# Patient Record
Sex: Female | Born: 1970
Health system: Southern US, Community
[De-identification: ages and names within clinical notes are randomized; demographics above are authoritative.]

## PROBLEM LIST (undated history)

## (undated) DIAGNOSIS — I10 Essential (primary) hypertension: Secondary | ICD-10-CM

## (undated) HISTORY — PX: ABDOMINAL HYSTERECTOMY: SHX81

## (undated) HISTORY — DX: Essential (primary) hypertension: I10

---

## 2006-05-11 ENCOUNTER — Ambulatory Visit: Payer: Self-pay | Admitting: Family Medicine

## 2008-02-08 HISTORY — PX: TUBAL LIGATION: SHX77

## 2008-12-09 ENCOUNTER — Inpatient Hospital Stay: Payer: Self-pay

## 2009-01-08 ENCOUNTER — Encounter: Payer: Self-pay | Admitting: Maternal & Fetal Medicine

## 2009-01-08 ENCOUNTER — Inpatient Hospital Stay: Payer: Self-pay | Admitting: Obstetrics and Gynecology

## 2010-11-18 IMAGING — US US OB US >=[ID] SNGL FETUS
1 series · 17 of 28 positions shown · non-contrast
Comparison: none

REASON FOR EXAM: Placenta Previa 32 weeks EGA
COMMENTS:

[Series 1: us ob us >=(id) sngl fetus · 17 of 33 slices shown]
[im 1/33]
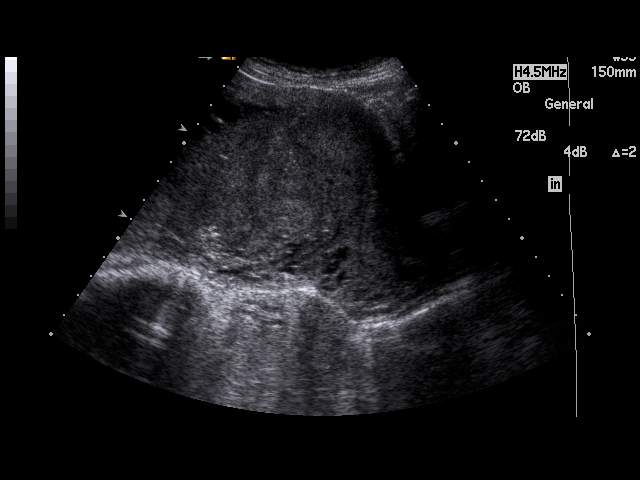
[im 3/33]
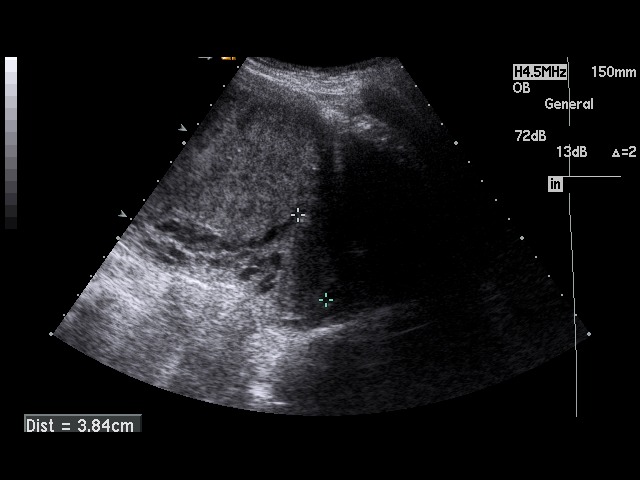
[im 5/33]
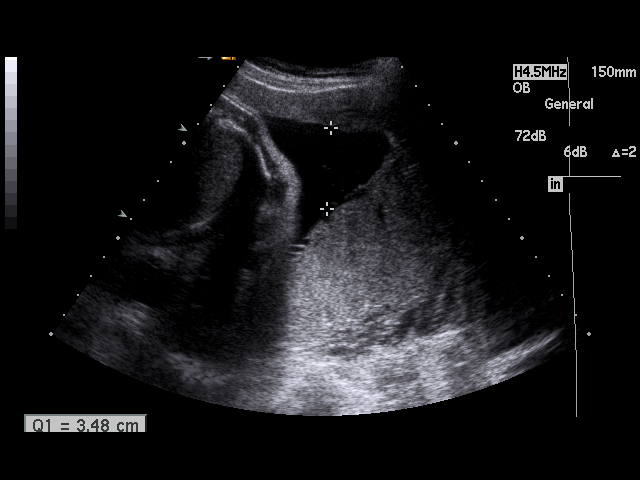
[im 6/33]
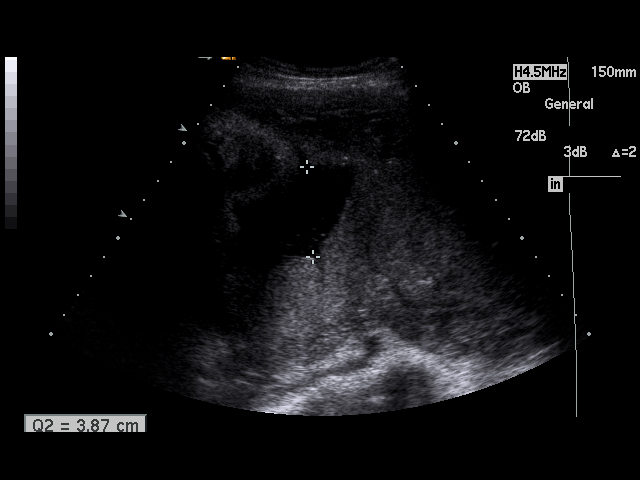
[im 9/33]
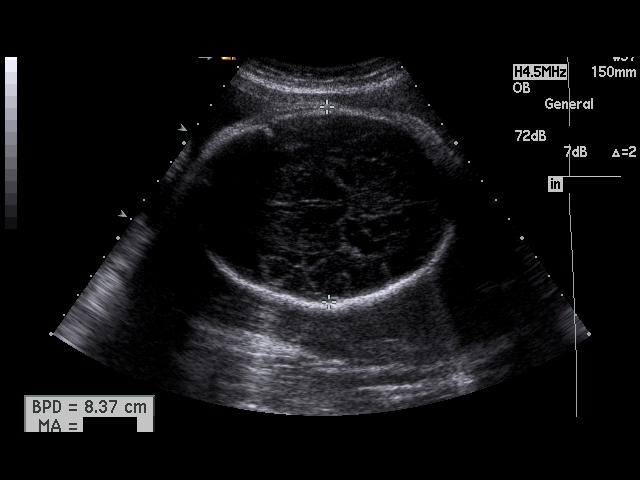
[im 11/33]
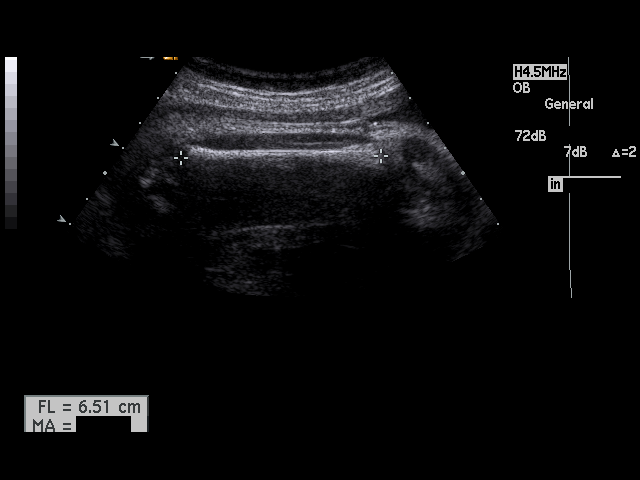
[im 12/33]
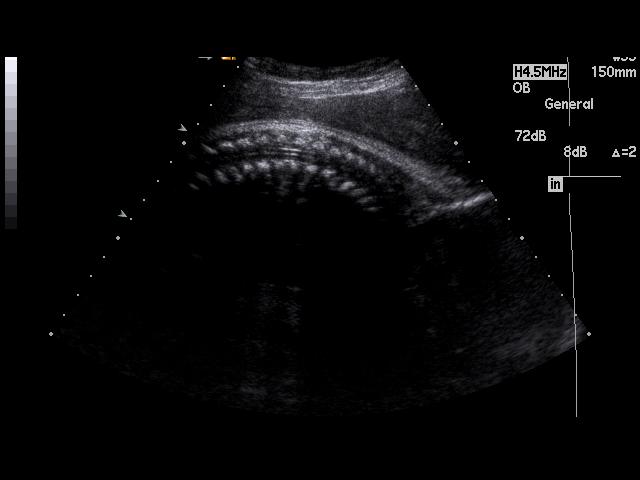
[im 15/33]
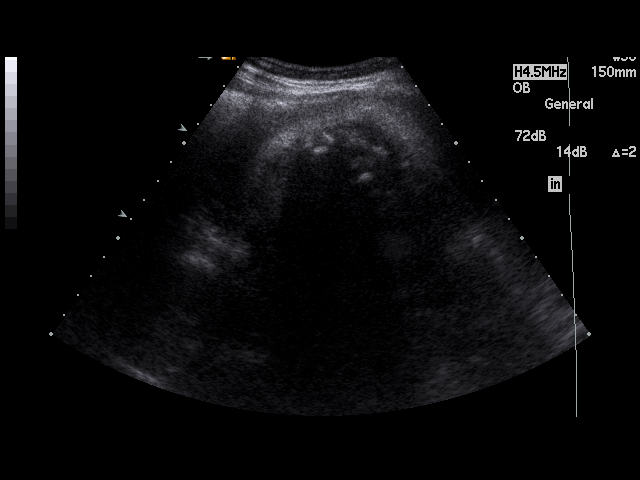
[im 17/33]
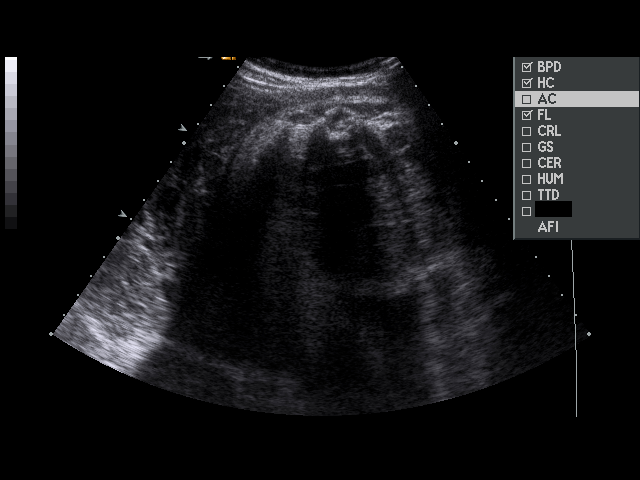
[im 18/33]
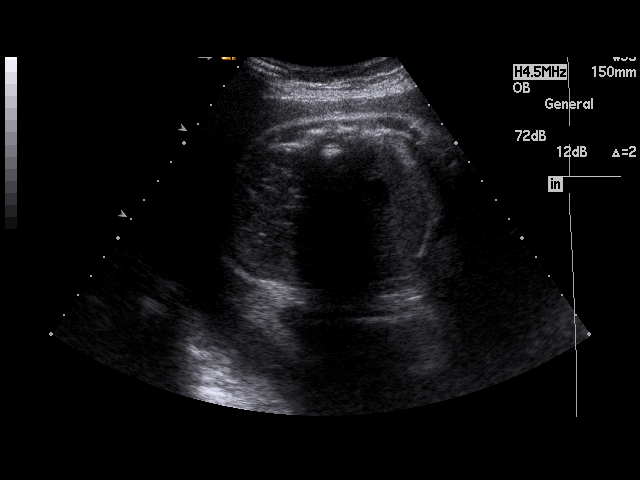
[im 21/33]
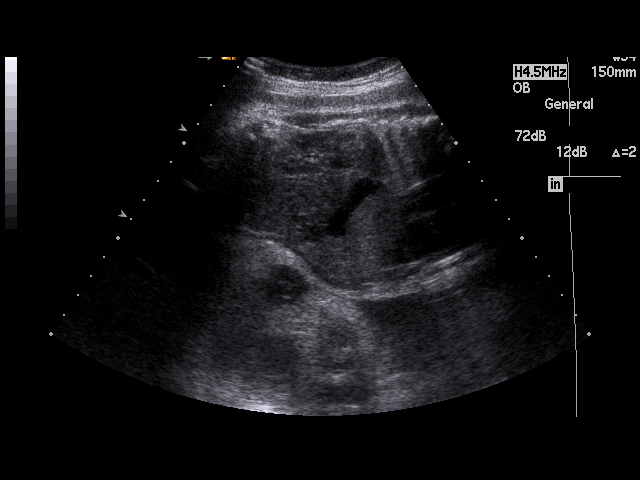
[im 22/33]
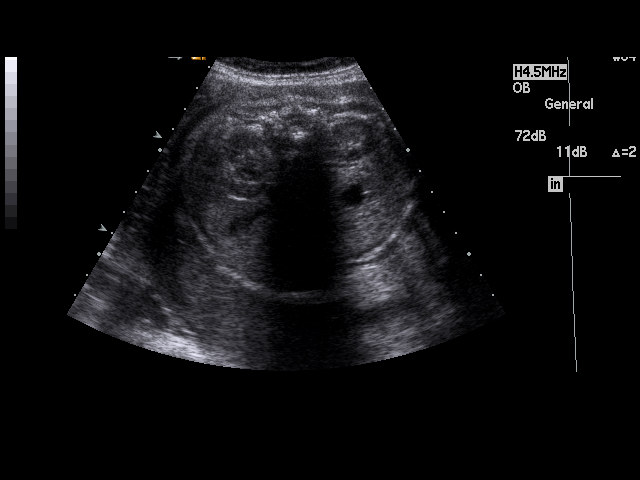
[im 24/33]
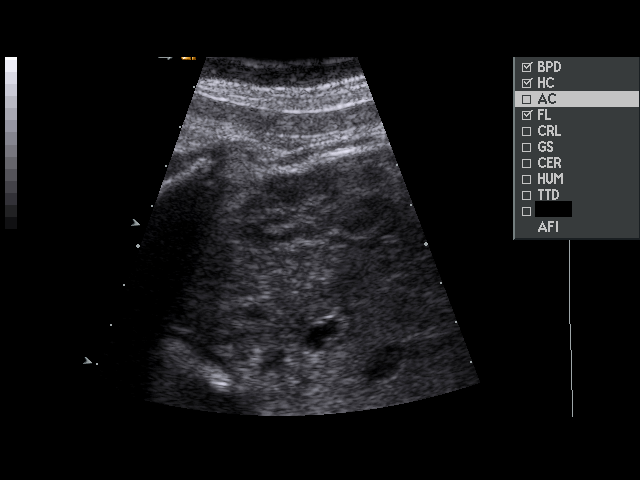
[im 27/33]
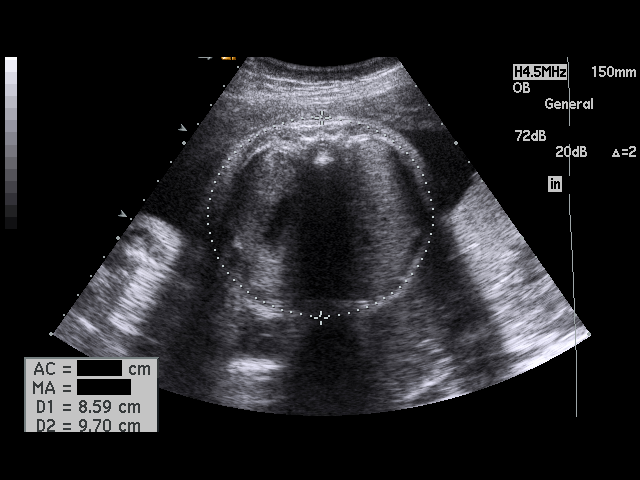
[im 28/33]
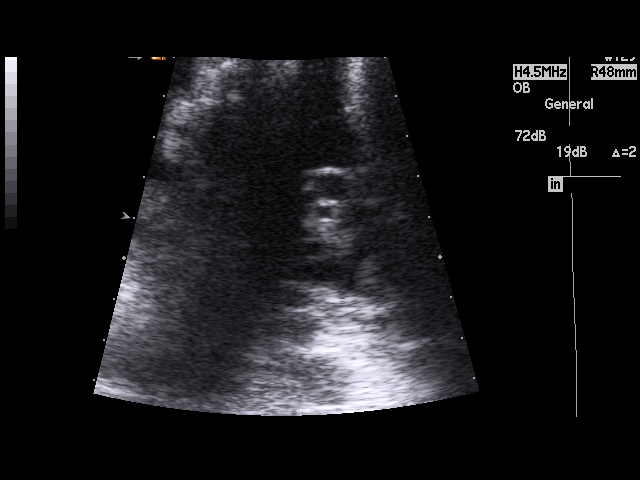
[im 30/33]
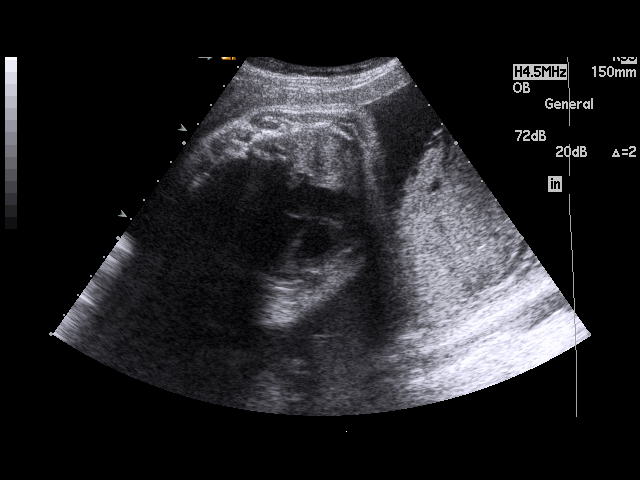
[im 33/33]
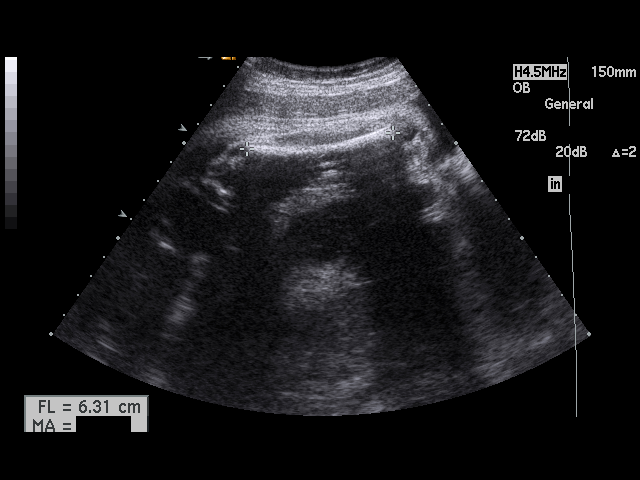

[17 of 28 positions shown; findings below may reference images not displayed]

PROCEDURE:     US  - US OB GREATER/OR EQUAL TO PDBN0  - December 12, 2008  [DATE]

RESULT:     There is observed a single living intrauterine gestation.
Presentation currently is transverse. Fetal heart rate was monitored at 163
beats per minute. Amnionic fluid volume appears normal. The placenta is low
and completely traverses the region of the cervix compatible with complete
placenta previa.

The fetal heart, stomach, and urinary bladder are visualized. No
hydrocephalus or hydronephrosis is seen. No fetal abnormalities are
identified. Fetal measurements are as follows:

BPD     8.37 cm      Corresponding to 33 weeks 5 days.
HC          30.98 cm      Corresponding to 34 weeks 4 days.
AC          28.46 cm      Corresponding to 32 weeks 3 days.
FL           6.41 cm     Corresponding to 33 weeks 1 day

EFW is to 2,083 grams + / - 312 grams. AFI is 14.72 cm. Average ultrasound age
is 33 weeks 3 days. Ultrasound EDD based on today's measurements is [DATE]. Living intrauterine gestation of approximately 33 weeks 3 days
gestational age.
2. Complete placenta previa is observed.
3. Fetal measurements are as noted above.

## 2010-12-15 IMAGING — US US OB DETAIL+14 WK - NRPT [facility]
1 series · 14 of 28 positions shown · non-contrast
Comparison: none

[Series 1: us ob detail+14 wk - nrpt [facility] · 14 of 66 slices shown]
[im 3/66]
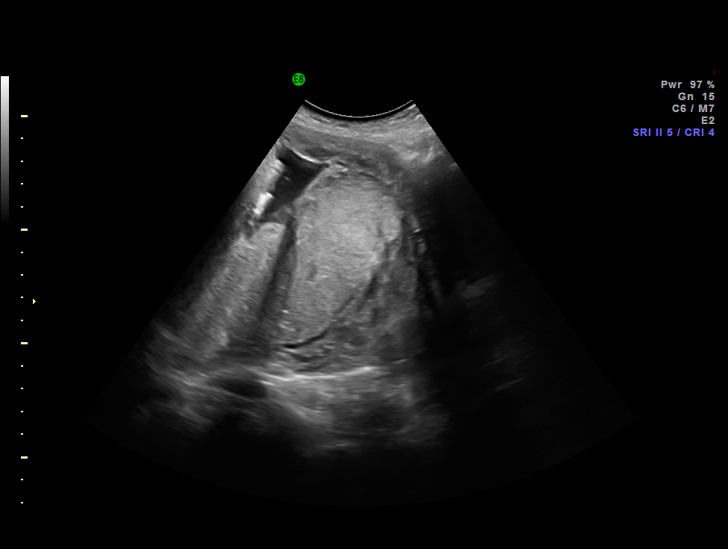
[im 8/66]
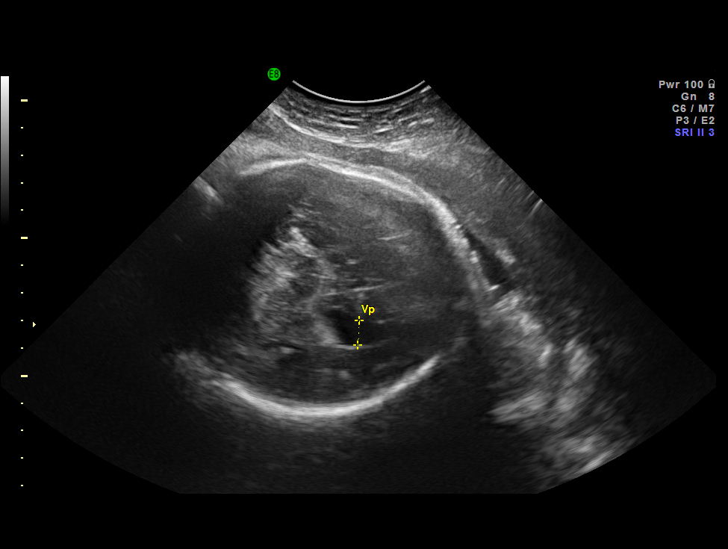
[im 13/66]
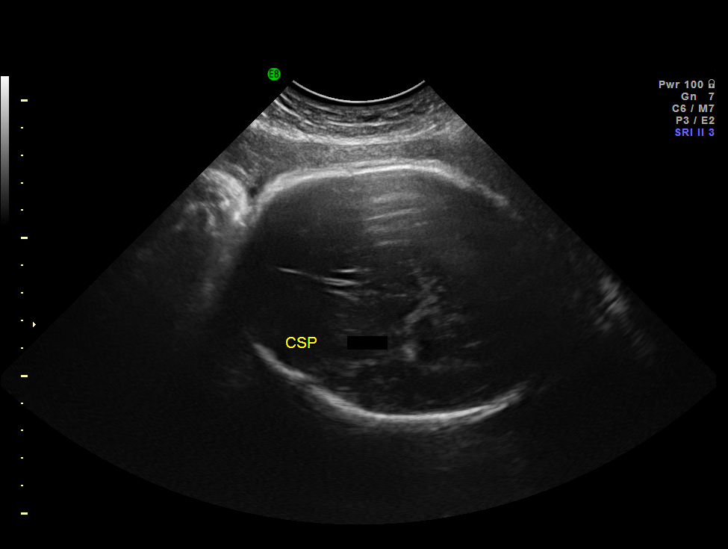
[im 17/66]
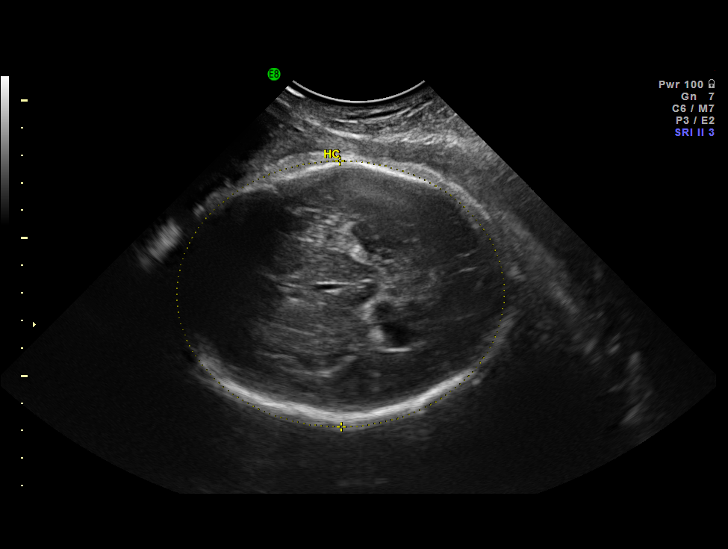
[im 22/66]
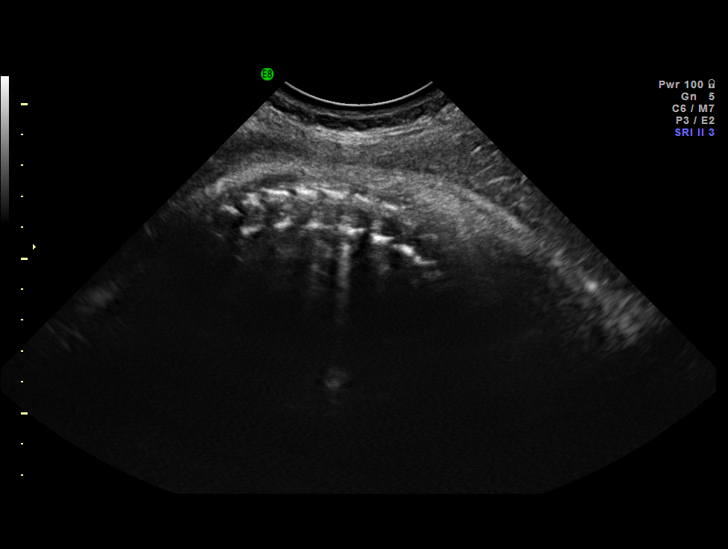
[im 27/66]
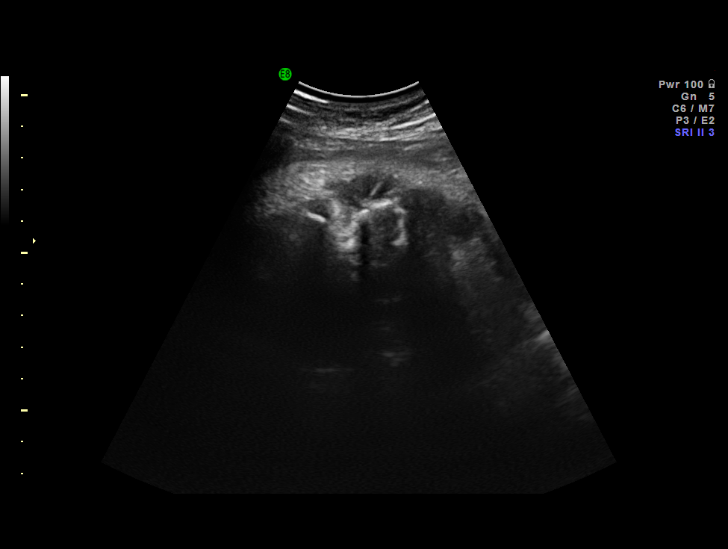
[im 32/66]
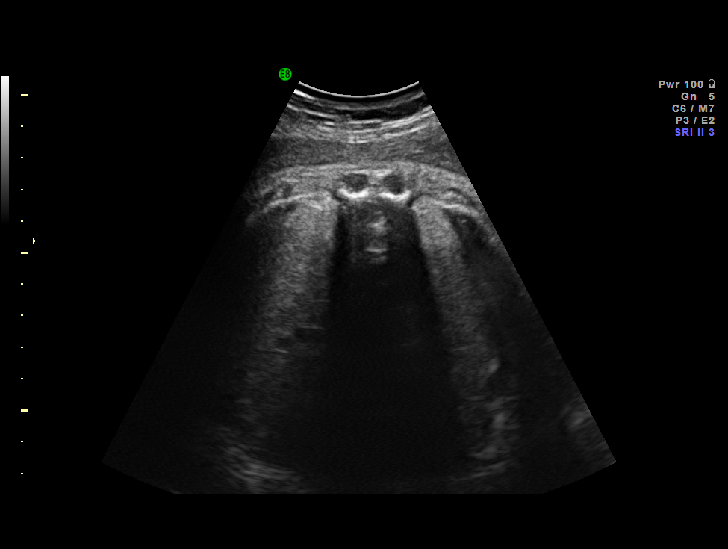
[im 37/66]
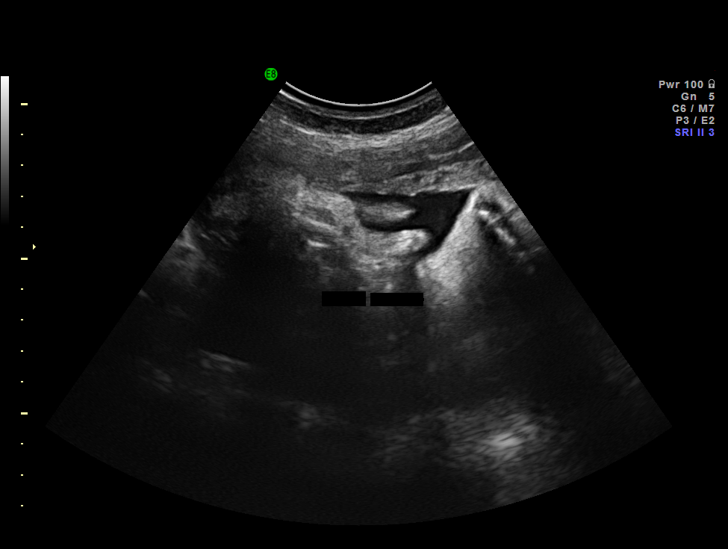
[im 41/66]
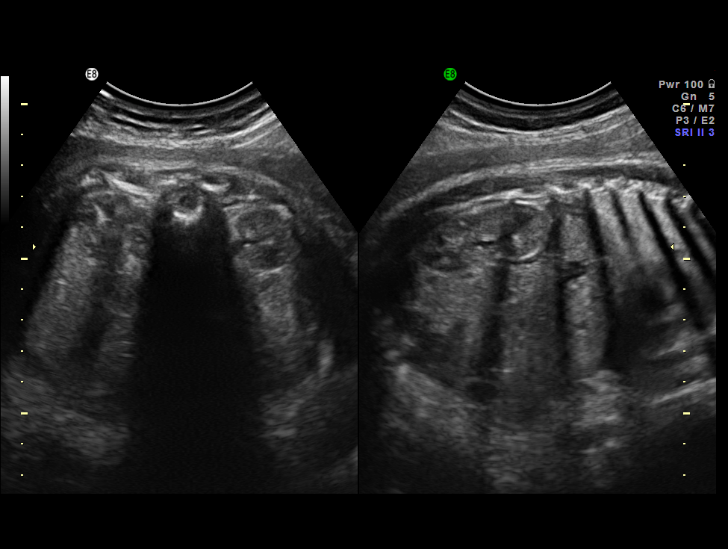
[im 46/66]
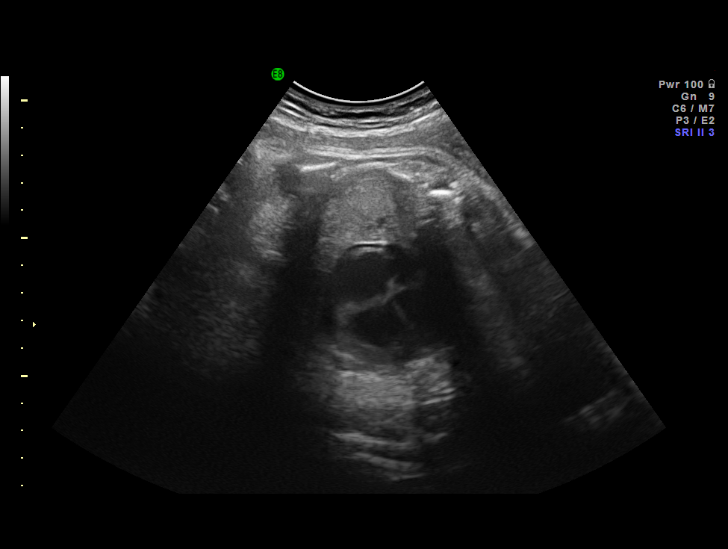
[im 51/66]
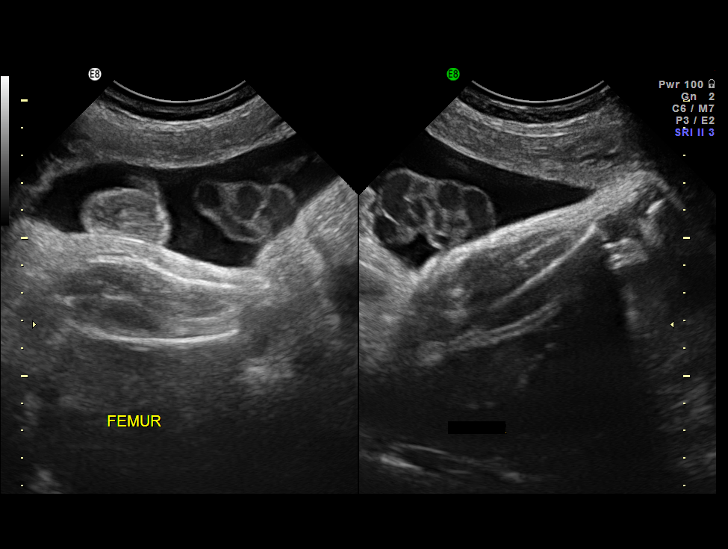
[im 56/66]
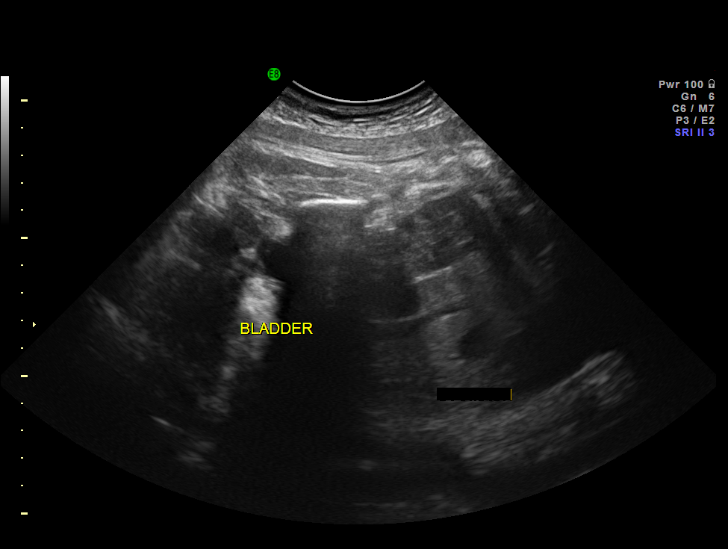
[im 61/66]
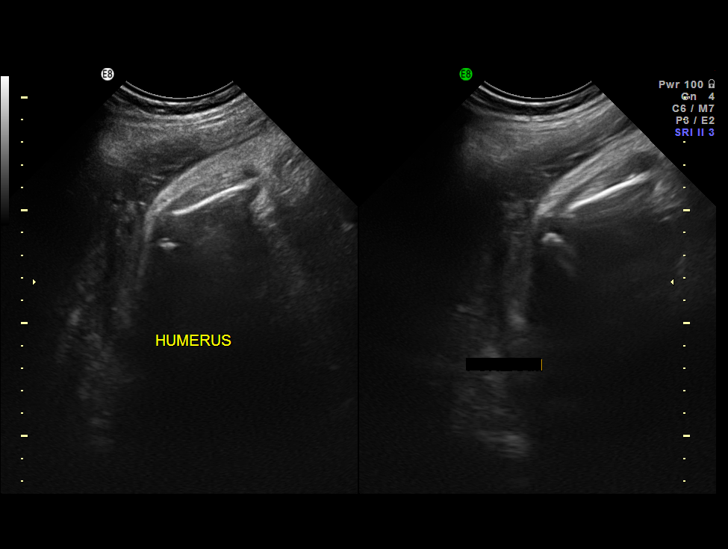
[im 66/66]
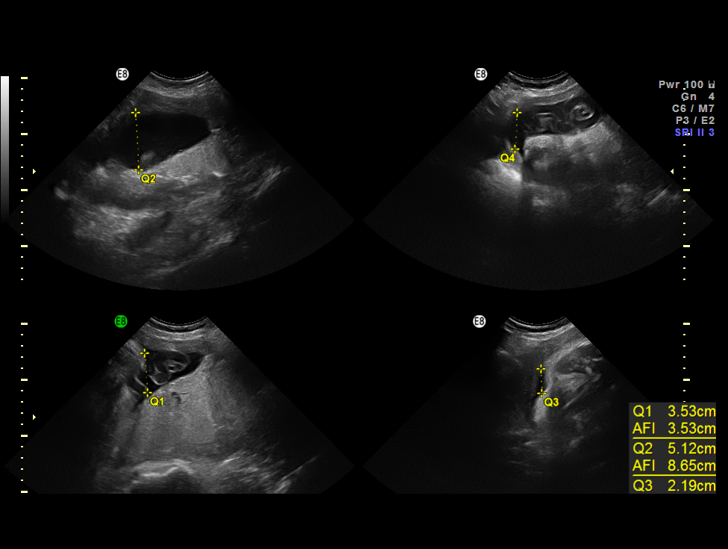

[14 of 28 positions shown; findings below may reference images not displayed]

IMAGES IMPORTED FROM THE [employer] WORKFLOW SYSTEM
NO DICTATION FOR STUDY

## 2013-02-07 HISTORY — PX: BREAST BIOPSY: SHX20

## 2013-05-14 DIAGNOSIS — R928 Other abnormal and inconclusive findings on diagnostic imaging of breast: Secondary | ICD-10-CM | POA: Insufficient documentation

## 2016-11-30 ENCOUNTER — Encounter: Payer: Self-pay | Admitting: Emergency Medicine

## 2016-11-30 ENCOUNTER — Emergency Department: Payer: 59

## 2016-11-30 ENCOUNTER — Emergency Department
Admission: EM | Admit: 2016-11-30 | Discharge: 2016-11-30 | Disposition: A | Discharge status: Home / Self Care | Payer: 59 | Attending: Emergency Medicine | Admitting: Emergency Medicine

## 2016-11-30 DIAGNOSIS — Z79899 Other long term (current) drug therapy: Secondary | ICD-10-CM | POA: Diagnosis not present

## 2016-11-30 DIAGNOSIS — G43809 Other migraine, not intractable, without status migrainosus: Secondary | ICD-10-CM

## 2016-11-30 DIAGNOSIS — G43909 Migraine, unspecified, not intractable, without status migrainosus: Secondary | ICD-10-CM | POA: Diagnosis not present

## 2016-11-30 DIAGNOSIS — R42 Dizziness and giddiness: Secondary | ICD-10-CM | POA: Diagnosis present

## 2016-11-30 LAB — CBC
HEMATOCRIT: 39.5 % (ref 35.0–47.0)
HEMOGLOBIN: 13 g/dL (ref 12.0–16.0)
MCH: 28.9 pg (ref 26.0–34.0)
MCHC: 32.9 g/dL (ref 32.0–36.0)
MCV: 88 fL (ref 80.0–100.0)
Platelets: 306 10*3/uL (ref 150–440)
RBC: 4.49 MIL/uL (ref 3.80–5.20)
RDW: 13.5 % (ref 11.5–14.5)
WBC: 7 10*3/uL (ref 3.6–11.0)

## 2016-11-30 LAB — COMPREHENSIVE METABOLIC PANEL
ALK PHOS: 52 U/L (ref 38–126)
ALT: 10 U/L — AB (ref 14–54)
AST: 14 U/L — AB (ref 15–41)
Albumin: 3.7 g/dL (ref 3.5–5.0)
Anion gap: 8 (ref 5–15)
BILIRUBIN TOTAL: 0.6 mg/dL (ref 0.3–1.2)
BUN: 15 mg/dL (ref 6–20)
CALCIUM: 8.8 mg/dL — AB (ref 8.9–10.3)
CHLORIDE: 109 mmol/L (ref 101–111)
CO2: 20 mmol/L — ABNORMAL LOW (ref 22–32)
Creatinine, Ser: 0.75 mg/dL (ref 0.44–1.00)
GFR calc Af Amer: >60 mL/min (ref >60)
Glucose, Bld: 91 mg/dL (ref 65–99)
Potassium: 4 mmol/L (ref 3.5–5.1)
Sodium: 137 mmol/L (ref 135–145)
TOTAL PROTEIN: 7 g/dL (ref 6.5–8.1)

## 2016-11-30 LAB — DIFFERENTIAL
BASOS ABS: 0 10*3/uL (ref 0–0.1)
Basophils Relative: 1 %
Eosinophils Absolute: 0.2 10*3/uL (ref 0–0.7)
Eosinophils Relative: 2 %
LYMPHS ABS: 1.6 10*3/uL (ref 1.0–3.6)
Lymphocytes Relative: 23 %
MONOS PCT: 8 %
Monocytes Absolute: 0.6 10*3/uL (ref 0.2–0.9)
NEUTROS ABS: 4.6 10*3/uL (ref 1.4–6.5)
Neutrophils Relative %: 66 %

## 2016-11-30 LAB — APTT: APTT: 32 s (ref 24–36)

## 2016-11-30 LAB — TROPONIN I

## 2016-11-30 LAB — PROTIME-INR
INR: 0.96
Prothrombin Time: 12.7 seconds (ref 11.4–15.2)

## 2016-11-30 MED ORDER — ETODOLAC 200 MG PO CAPS: 200.0000 mg | ORAL_CAPSULE | Freq: Three times a day (TID) | ORAL | 0 refills | Status: DC

## 2016-11-30 MED ORDER — SODIUM CHLORIDE 0.9 % IV BOLUS (SEPSIS)
1000.0000 mL | Freq: Once | INTRAVENOUS | Status: AC
  Administered 2016-11-30: 1000 mL via INTRAVENOUS

## 2016-11-30 MED ORDER — METOCLOPRAMIDE HCL 5 MG/ML IJ SOLN
10.0000 mg | Freq: Once | INTRAMUSCULAR | Status: AC
Start: 2016-11-30 — End: 2016-11-30
  Administered 2016-11-30: 10 mg via INTRAVENOUS
  Filled 2016-11-30: qty 2

## 2016-11-30 MED ORDER — KETOROLAC TROMETHAMINE 30 MG/ML IJ SOLN
30.0000 mg | Freq: Once | INTRAMUSCULAR | Status: AC
  Administered 2016-11-30: 30 mg via INTRAVENOUS
  Filled 2016-11-30: qty 1

## 2016-11-30 MED ORDER — METOCLOPRAMIDE HCL 10 MG PO TABS: 10.0000 mg | ORAL_TABLET | Freq: Three times a day (TID) | ORAL | 0 refills | Status: DC | PRN

## 2016-11-30 MED ORDER — DIPHENHYDRAMINE HCL 50 MG/ML IJ SOLN
25.0000 mg | Freq: Once | INTRAMUSCULAR | Status: AC
  Administered 2016-11-30: 25 mg via INTRAVENOUS
  Filled 2016-11-30: qty 1

## 2016-11-30 NOTE — ED Notes (Signed)
Pt in CT.

## 2016-11-30 NOTE — Discharge Instructions (Signed)
Your workup at this time is unremarkable. Her symptoms have improved with some fluids and medication. I will discharge her to follow-up with neurology and your primary care physician. Please continue taking ketorolac and Reglan as needed for headache pain. Please return with any worsening condition or any other symptoms.

## 2016-11-30 NOTE — ED Provider Notes (Signed)
[facility] Emergency Department Provider Note   ____________________________________________   First MD Initiated Contact with Patient 11/30/16 [phone]     (approximate)  I have reviewed the triage vital signs and the nursing notes.   HISTORY  Chief Complaint Dizziness    HPI Shirley Harris is a 46 y.o. female who comes into the hospital today with dizziness. The patient reports that she woke up this morning around 5 AM. She states that she got out of bed to use the restroom and she became very dizzy. She reports that she felt that she was leaning to the left although she was trying to go off to the right. The patient also stumbled backwards a few times. She has some pain on the left side of her head. The patient states that she's had a headache for the past 2 days but she has had a history of posttraumatic migraines. The patient states that she had an injury as a child and she's had a headache since then although not as frequent as an adult. The patient reports that she went to bed last night around 11:00 and had felt fine aside from her headache but woke up this morning with the symptoms. The patient has no blurred vision and no nausea or vomiting. She denies any numbness or weakness anywhere. She states that she took a Motrin around 2 PM yesterday. The patient denies chest pain or shortness of breath. Her headache at this time is about a 5 out of 10 in intensity. She reports that she is used to having headaches and this is not the worst ever. She decided to come in because she felt so unsteady. She is here for evaluation.   History reviewed. No pertinent past medical history.  There are no active problems to display for this patient.   Past Surgical History:  Procedure Laterality Date  . CESAREAN SECTION      Prior to Admission medications   Medication Sig Start Date End Date Taking? Authorizing Provider  ibuprofen (ADVIL,MOTRIN) 200 MG tablet Take 200-800  mg by mouth every 6 (six) hours as needed.   Yes [provider]  loratadine (CLARITIN) 10 MG tablet Take 10 mg by mouth daily as needed for allergies.   Yes [provider]  etodolac (LODINE) 200 MG capsule Take 1 capsule (200 mg total) by mouth every 8 (eight) hours. 11/30/16   Loney Hering, MD  metoCLOPramide (REGLAN) 10 MG tablet Take 1 tablet (10 mg total) by mouth every 8 (eight) hours as needed. 11/30/16   Loney Hering, MD    Allergies Acetaminophen  No family history on file.  Social History Social History  Substance Use Topics  . Smoking status: Never Smoker  . Smokeless tobacco: Not on file  . Alcohol use Yes    Review of Systems  Constitutional: No fever/chills Eyes: No visual changes. ENT: No sore throat. Cardiovascular: Denies chest pain. Respiratory: Denies shortness of breath. Gastrointestinal: No abdominal pain.  No nausea, no vomiting.  No diarrhea.  No constipation. Genitourinary: Negative for dysuria. Musculoskeletal: Negative for back pain. Skin: Negative for rash. Neurological: dizziness and off balance, headache   ____________________________________________   PHYSICAL EXAM:  VITAL SIGNS: ED Triage Vitals  Enc Vitals Group     BP 11/30/16 0551 140/80     Pulse Rate 11/30/16 0551 84     Resp 11/30/16 0551 18     Temp 11/30/16 0551 98 F (36.7 C)  Temp Source 11/30/16 0551 Oral     SpO2 11/30/16 0551 95 %     Weight 11/30/16 0553 150 lb (68 kg)     Height 11/30/16 0553 5\' 6"  (1.676 m)     Head Circumference --      Peak Flow --      Pain Score 11/30/16 0552 5     Pain Loc --      Pain Edu? --      Excl. in [place]? --     Constitutional: Alert and oriented. Well appearing and in Moderate distress. Eyes: Conjunctivae are normal. PERRL. EOMI. Head: Atraumatic. Nose: No congestion/rhinnorhea. Mouth/Throat: Mucous membranes are moist.  Oropharynx non-erythematous. Cardiovascular: Normal rate, regular rhythm.  Grossly normal heart sounds.  Good peripheral circulation. Respiratory: Normal respiratory effort.  No retractions. Lungs CTAB. Gastrointestinal: Soft and nontender. No distention. positive bowel sounds Musculoskeletal: No lower extremity tenderness nor edema.  Neurologic:  Normal speech and language. cranial nerves II through XII are grossly intact with no focal motor neuro deficits, finger to nose intact, rapid alternating movement intact, sensation intact throughout. Skin:  Skin is warm, dry and intact.  Psychiatric: Mood and affect are normal.   ____________________________________________   LABS (all labs ordered are listed, but only abnormal results are displayed)  Labs Reviewed  COMPREHENSIVE METABOLIC PANEL - Abnormal; Notable for the following:       Result Value   CO2 20 (*)    Calcium 8.8 (*)    AST 14 (*)    ALT 10 (*)    All other components within normal limits  PROTIME-INR  APTT  CBC  DIFFERENTIAL  TROPONIN I   ____________________________________________  EKG  ED ECG REPORT I, Loney Hering, the attending physician, personally viewed and interpreted this ECG.   Date: 11/30/2016  EKG Time: 610  Rate: 80  Rhythm: normal sinus rhythm  Axis: normal  Intervals:none  ST&T Change: none  ____________________________________________  RADIOLOGY  Ct Head Code Stroke Wo Contrast  Result Date: 11/30/2016 CLINICAL DATA:  Code stroke. Headache for few days. Gait instability. EXAM: CT HEAD WITHOUT CONTRAST TECHNIQUE: Contiguous axial images were obtained from the base of the skull through the vertex without intravenous contrast. COMPARISON:  None. FINDINGS: BRAIN: No intraparenchymal hemorrhage, mass effect nor midline shift. The ventricles and sulci are normal. No acute large vascular territory infarcts. No abnormal extra-axial fluid collections. Basal cisterns are patent. VASCULAR: Trace calcific atherosclerosis carotid siphon. SKULL/SOFT TISSUES: No skull  fracture. No significant soft tissue swelling. ORBITS/SINUSES: The included ocular globes and orbital contents are normal.The mastoid aircells and included paranasal sinuses are well-aerated. OTHER: None. ASPECTS [facility] Stroke Program Early CT Score) - Ganglionic level infarction (caudate, lentiform nuclei, internal capsule, insula, M1-M3 cortex): 7 - Supraganglionic infarction (M4-M6 cortex): 3 Total score (0-10 with 10 being normal): 10 IMPRESSION: 1. Negative noncontrast CT HEAD. 2. ASPECTS is 10. 3. Critical Value/emergent results were called by telephone at the time of interpretation on 11/30/2016 at 6:10 am to Dr. Owens Shark , who verbally acknowledged these results. Electronically Signed   By: Elon Alas M.D.   On: 11/30/2016 06:11    ____________________________________________   PROCEDURES  Procedure(s) performed: None  Procedures  Critical Care performed: No  ____________________________________________   INITIAL IMPRESSION / ASSESSMENT AND PLAN / ED COURSE  As part of my medical decision making, I reviewed the following data within the electronic MEDICAL RECORD NUMBER Notes from prior ED visits and [place] Controlled Substance Database  This is a 46 year old female who comes into the hospital today with some headache and dizziness. The patient was very off balance when she woke up from bed today.  My differential diagnosis includes stroke, TIA, complex migraine, vertigo  The patient was initially called a code stroke when she arrived in the emergency department. When I noted that the patient did not have neurologic deficits I canceled because showed. The patient did receive a CT scan of her head that was unremarkable. I gave the patient a dose of Reglan, Benadryl and Toradol and a liter of normal saline. The patient's headache did improve and her dizziness also improved. I feel that the patient may be having a complex migraine given her history of migraines and atraumatic brain  injury. I did have the patient ambulate and she was able to walk without difficulty and did not feel off balance. The patient feels well. I will have her follow-up with her primary care physician. She'll be discharged home and has no other concerns or complaints.      ____________________________________________   FINAL CLINICAL IMPRESSION(S) / ED DIAGNOSES  Final diagnoses:  Dizziness  Other migraine without status migrainosus, not intractable      NEW MEDICATIONS STARTED DURING THIS VISIT:  New Prescriptions   ETODOLAC (LODINE) 200 MG CAPSULE    Take 1 capsule (200 mg total) by mouth every 8 (eight) hours.   METOCLOPRAMIDE (REGLAN) 10 MG TABLET    Take 1 tablet (10 mg total) by mouth every 8 (eight) hours as needed.     Note:  This document was prepared using Dragon voice recognition software and may include unintentional dictation errors.    Loney Hering, MD 11/30/16 [phone]

## 2016-11-30 NOTE — ED Triage Notes (Signed)
Patient states she has had headaches last couple of days. States she woke up and felt ok this am. At approx 0505, patient was walking trying to walk forward, kept walking backwards. States she feels like she is leaning to left, but body goes to right.

## 2016-11-30 NOTE — ED Notes (Signed)
Pt in room. MD at bedside.

## 2016-11-30 NOTE — ED Notes (Signed)
Patient does not appear to be in any acute distress at time of discharge. Patient ambulatory to lobby with steady gate. Patient denies any comments or concerns regarding discharge.  

## 2016-12-02 DIAGNOSIS — G44229 Chronic tension-type headache, not intractable: Secondary | ICD-10-CM | POA: Insufficient documentation

## 2016-12-02 HISTORY — DX: Chronic tension-type headache, not intractable: G44.229

## 2017-05-18 DIAGNOSIS — G44229 Chronic tension-type headache, not intractable: Secondary | ICD-10-CM | POA: Diagnosis not present

## 2017-05-24 DIAGNOSIS — Z8669 Personal history of other diseases of the nervous system and sense organs: Secondary | ICD-10-CM | POA: Diagnosis not present

## 2017-05-24 DIAGNOSIS — I10 Essential (primary) hypertension: Secondary | ICD-10-CM | POA: Diagnosis not present

## 2017-06-05 DIAGNOSIS — L03115 Cellulitis of right lower limb: Secondary | ICD-10-CM | POA: Diagnosis not present

## 2017-11-09 NOTE — Progress Notes (Deleted)
Pt presents today for annual exams.

## 2017-11-10 ENCOUNTER — Encounter: Payer: Self-pay | Admitting: Obstetrics and Gynecology

## 2017-11-16 ENCOUNTER — Encounter: Payer: Self-pay | Admitting: Obstetrics and Gynecology

## 2017-11-16 ENCOUNTER — Ambulatory Visit (INDEPENDENT_AMBULATORY_CARE_PROVIDER_SITE_OTHER): Payer: BLUE CROSS/BLUE SHIELD | Admitting: Obstetrics and Gynecology

## 2017-11-16 VITALS — BP 142/91 | HR 101 | Ht 66.0 in | Wt 168.0 lb

## 2017-11-16 DIAGNOSIS — Z0000 Encounter for general adult medical examination without abnormal findings: Secondary | ICD-10-CM

## 2017-11-16 DIAGNOSIS — Z01411 Encounter for gynecological examination (general) (routine) with abnormal findings: Secondary | ICD-10-CM | POA: Diagnosis not present

## 2017-11-16 DIAGNOSIS — L292 Pruritus vulvae: Secondary | ICD-10-CM

## 2017-11-16 DIAGNOSIS — L900 Lichen sclerosus et atrophicus: Secondary | ICD-10-CM | POA: Diagnosis not present

## 2017-11-16 DIAGNOSIS — D219 Benign neoplasm of connective and other soft tissue, unspecified: Secondary | ICD-10-CM | POA: Diagnosis not present

## 2017-11-16 MED ORDER — CLOBETASOL PROPIONATE 0.05 % EX CREA: 1.0000 "application " | TOPICAL_CREAM | Freq: Two times a day (BID) | CUTANEOUS | 2 refills | Status: DC

## 2017-11-16 NOTE — Progress Notes (Signed)
NP patient here today for annual exam. Pt states she is having concerns about high blood pressure, also she has itching in pelvic area she believes is due to exzema. Pt has not had a pap in several years and would like to have one today.

## 2017-11-16 NOTE — Progress Notes (Signed)
HPI:      Shirley Harris is a 47 y.o. [id number] who LMP was Patient's last menstrual period was 11/08/2017 (exact date).  Subjective:   She presents today for her annual examination.  She is delinquent in her medical care in general.  She has not had a mammogram in more than 4 years and at that time she had a breast biopsy performed.  She reports she has not had a Pap smear in the same amount of time.  She has an appointment with a family physician later this month for blood pressure management cholesterol and general medical care. She complains of a chronic vaginal itching that she reports is mostly vulvar and surrounding the vagina.  She denies vaginal discharge.  She denies new sexual partners. She says that her periods are becoming very heavy with significant cramping and clotting over the last few years.    Hx: The following portions of the patient's history were reviewed and updated as appropriate:             She  has no past medical history on file. She does not have a problem list on file. She  has a past surgical history that includes Cesarean section and Tubal ligation (2010). Her family history includes Multiple sclerosis in her father; Stroke in her mother. She  reports that she has never smoked. She has never used smokeless tobacco. She reports that she drinks alcohol. She reports that she does not use drugs. She has a current medication list which includes the following prescription(s): loratadine, nortriptyline, and rizatriptan. She is allergic to acetaminophen and shellfish allergy.       Review of Systems:  Review of Systems  Constitutional: Denied constitutional symptoms, night sweats, recent illness, fatigue, fever, insomnia and weight loss.  Eyes: Denied eye symptoms, eye pain, photophobia, vision change and visual disturbance.  Ears/Nose/Throat/Neck: Denied ear, nose, throat or neck symptoms, hearing loss, nasal discharge, sinus congestion and sore throat.   Cardiovascular: Denied cardiovascular symptoms, arrhythmia, chest pain/pressure, edema, exercise intolerance, orthopnea and palpitations.  Respiratory: Denied pulmonary symptoms, asthma, pleuritic pain, productive sputum, cough, dyspnea and wheezing.  Gastrointestinal: Denied, gastro-esophageal reflux, melena, nausea and vomiting.  Genitourinary: Denied genitourinary symptoms including symptomatic vaginal discharge, pelvic relaxation issues, and urinary complaints.  Musculoskeletal: Denied musculoskeletal symptoms, stiffness, swelling, muscle weakness and myalgia.  Dermatologic: Denied dermatology symptoms, rash and scar.  Neurologic: Denied neurology symptoms, dizziness, headache, neck pain and syncope.  Psychiatric: Denied psychiatric symptoms, anxiety and depression.  Endocrine: Denied endocrine symptoms including hot flashes and night sweats.   Meds:   Current Outpatient Medications on File Prior to Visit  Medication Sig Dispense Refill  . loratadine (CLARITIN) 10 MG tablet Take 10 mg by mouth daily as needed for allergies.    Marland Kitchen nortriptyline (PAMELOR) 10 MG capsule Take 10 mg by mouth 3 (three) times daily.    . rizatriptan (MAXALT) 10 MG tablet Take 10 mg by mouth as needed for migraine. May repeat in 2 hours if needed     No current facility-administered medications on file prior to visit.     Objective:     Vitals:   11/16/17 0850  BP: (!) 142/91  Pulse: (!) 101              Physical examination General NAD, Conversant  HEENT Atraumatic; Op clear with mmm.  Normo-cephalic. Pupils reactive. Anicteric sclerae  Thyroid/Neck Smooth without nodularity or enlargement. Normal ROM.  Neck Supple.  Skin No rashes, lesions  or ulceration. Normal palpated skin turgor. No nodularity.  Breasts:  Small nodular structure left breast approximately 3:00 less than 1 cm.  Patient states this is where she had a previous breast biopsy and was found to be a "cyst".  Symmetric.  No axillary  adenopathy.  Lungs: Clear to auscultation.No rales or wheezes. Normal Respiratory effort, no retractions.  Heart: NSR.  No murmurs or rubs appreciated. No periferal edema  Abdomen: Soft.  Non-tender.  No masses.  No HSM. No hernia  Extremities: Moves all appropriately.  Normal ROM for age. No lymphadenopathy.  Neuro: Oriented to PPT.  Normal mood. Normal affect.     Pelvic:   Vulva:  Lichen sclerosus noted  Vagina: No lesions or abnormalities noted.  Support: Normal pelvic support.  Urethra No masses tenderness or scarring.  Meatus Normal size without lesions or prolapse.  Cervix: Normal appearance.  No lesions.  Anus: Normal exam.  No lesions.  Perineum: Normal exam.  No lesions.        Bimanual   Uterus:  12 to 14 weeks size irregular firm  Adnexae: No masses.  Non-tender to palpation.  Cul-de-sac: Negative for abnormality.   WET PREP: clue cells: absent, KOH (yeast): negative, odor: absent and trichomoniasis: negative Ph:  < 4.5    Assessment:    [id number]    1. Encounter for annual physical exam   2. Vulvar itching   3. Lichen sclerosus   4. Fibroids     Itching likely secondary to lichen sclerosus.  Doubt chronic yeast.  Enlarged uterus most likely consistent with fibroids.  This may explain her worsening this menorrhea and menorrhagia.   Plan:            1.  Basic Screening Recommendations The basic screening recommendations for asymptomatic women were discussed with the patient during her visit.  The age-appropriate recommendations were discussed with her and the rational for the tests reviewed.  When I am informed by the patient that another primary care physician has previously obtained the age-appropriate tests and they are up-to-date, only outstanding tests are ordered and referrals given as necessary.  Abnormal results of tests will be discussed with her when all of her results are completed. Pap performed- mammogram ordered Patient will see family physician  later this month for medical care and treatment of hypertension.  2.  Clobetasol for lichen sclerosus 3.  Ultrasound to delineate location and size of uterine fibroids Orders No orders of the defined types were placed in this encounter.   No orders of the defined types were placed in this encounter.       F/U  No follow-ups on file.  Finis Bud, M.D. 11/16/2017 9:41 AM

## 2017-11-18 LAB — IGP, COBASHPV16/18
HPV 16: NEGATIVE
HPV 18: NEGATIVE
HPV other hr types: NEGATIVE
PAP Smear Comment: 0

## 2017-11-20 ENCOUNTER — Telehealth: Payer: Self-pay

## 2017-11-20 NOTE — Telephone Encounter (Signed)
Left pt a VM instructing her to return call.

## 2017-11-21 NOTE — Telephone Encounter (Signed)
Left pt another message regarding her results (normal pap and HPV neg), instructed pt to return call if she had any questions.

## 2017-11-22 NOTE — Telephone Encounter (Signed)
Third unsuccessful attempt to reach pt. Will send letter with results.

## 2017-12-04 ENCOUNTER — Encounter: Payer: Self-pay | Admitting: Family Medicine

## 2017-12-04 ENCOUNTER — Ambulatory Visit (INDEPENDENT_AMBULATORY_CARE_PROVIDER_SITE_OTHER): Payer: BLUE CROSS/BLUE SHIELD | Admitting: Family Medicine

## 2017-12-04 VITALS — BP 116/70 | HR 88 | Temp 98.1°F | Resp 16 | Ht 66.0 in | Wt 161.8 lb

## 2017-12-04 DIAGNOSIS — I10 Essential (primary) hypertension: Secondary | ICD-10-CM

## 2017-12-04 DIAGNOSIS — E663 Overweight: Secondary | ICD-10-CM | POA: Diagnosis not present

## 2017-12-04 DIAGNOSIS — Z114 Encounter for screening for human immunodeficiency virus [HIV]: Secondary | ICD-10-CM

## 2017-12-04 DIAGNOSIS — Z7689 Persons encountering health services in other specified circumstances: Secondary | ICD-10-CM

## 2017-12-04 DIAGNOSIS — G4700 Insomnia, unspecified: Secondary | ICD-10-CM | POA: Insufficient documentation

## 2017-12-04 DIAGNOSIS — G43909 Migraine, unspecified, not intractable, without status migrainosus: Secondary | ICD-10-CM | POA: Insufficient documentation

## 2017-12-04 DIAGNOSIS — Z23 Encounter for immunization: Secondary | ICD-10-CM

## 2017-12-04 DIAGNOSIS — Z1159 Encounter for screening for other viral diseases: Secondary | ICD-10-CM | POA: Diagnosis not present

## 2017-12-04 MED ORDER — LISINOPRIL 10 MG PO TABS: 10.0000 mg | ORAL_TABLET | Freq: Every day | ORAL | 1 refills | Status: DC

## 2017-12-04 NOTE — Progress Notes (Signed)
Name: Shirley Harris   MRN: [id number]    DOB: [birth year]   Date:12/04/2017       Progress Note  Subjective  Chief Complaint  Chief Complaint  Patient presents with  . Establish Care  . Hypertension    BP was elevated at wellness visit     HPI  Pt presents to establish care and for the following concerns:  HTN: She was diagnosed in the Spring with HTN at [facility], has had elevated readings at [facility] 10/10/2019142/91, in August with [facility] check it was 141/90, and with Dr. Melrose Harris in April 2019 she was 140/102.  Today she is at goal. Dr. Lannie Harris office recommended she be started on BP medication to see if this helps with her chronic headaches. We will treat with 10mg  Lisinopril today and will follow up in 2 weeks for labs and BP check.  She denies any worsening headaches, blurred vision, limb weakness, confusion, slurred speech, facial droop. Discussed migraine diet in detail.  Insomnia - has difficulty falling asleep.  She has a lot of stress going on - she is a Human resources officer for [employer].  Discussed sleep hygiene in detail - we will start with this first and consider medication if not effective.  Chronic Migraines - seeing Dr. Melrose Harris, sometimes has cluster headaches; she is taking maxalt PRN and nortriptyline.  She did have an episode 1 year ago that put her in the ER where she had a complex migraine - was lightheaded and had one-sided weakness (left side)  AR: Taking clairitn PRN for AR, has been several months since she has needed this. Usually worse in the summer.  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  Has cut down drastically on sweet beverages - only has 1 soda every 2 weeks, sweet tea about 3 times a week.  Drinking more water.  Last A1C was 5.5% with [facility].   There are no active problems to display for this patient.   Past Surgical History:  Procedure Laterality Date    . CESAREAN SECTION    . TUBAL LIGATION  2010    Family History  Problem Relation Age of Onset  . Stroke Mother   . Multiple sclerosis Father     Social History   Socioeconomic History  . Marital status: Married    Spouse name: Shirley Harris   . Number of children: 3  . Years of education: Not on file  . Highest education level: Not on file  Occupational History  . Not on file  Social Needs  . Financial resource strain: Not hard at all  . Food insecurity:    Worry: Never true    Inability: Never true  . Transportation needs:    Medical: No    Non-medical: No  Tobacco Use  . Smoking status: Never Smoker  . Smokeless tobacco: Never Used  Substance and Sexual Activity  . Alcohol use: Not Currently  . Drug use: Never  . Sexual activity: Yes    Partners: Male    Birth control/protection: Surgical  Lifestyle  . Physical activity:    Days per week: 2 days    Minutes per session: 30 min  . Stress: Only a little  Relationships  . Social connections:    Talks on phone: More than three times a week    Gets together: More than three times a week    Attends religious  service: More than 4 times per year    Active member of club or organization: Yes    Attends meetings of clubs or organizations: More than 4 times per year    Relationship status: Married  . Intimate partner violence:    Fear of current or ex partner: No    Emotionally abused: No    Physically abused: No    Forced sexual activity: No  Other Topics Concern  . Not on file  Social History Narrative  . Not on file     Current Outpatient Medications:  .  clobetasol cream (TEMOVATE) 2.70 %, Apply 1 application topically 2 (two) times daily. Use for 30 days twice a day as directed then use daily as directed., Disp: 60 g, Rfl: 2 .  loratadine (CLARITIN) 10 MG tablet, Take 10 mg by mouth daily as needed for allergies., Disp: , Rfl:  .  nortriptyline (PAMELOR) 10 MG capsule, Take 10 mg by mouth 3 (three) times  daily., Disp: , Rfl:  .  rizatriptan (MAXALT) 10 MG tablet, Take 10 mg by mouth as needed for migraine. May repeat in 2 hours if needed, Disp: , Rfl:  .  lisinopril (PRINIVIL,ZESTRIL) 10 MG tablet, Take 1 tablet (10 mg total) by mouth daily., Disp: 30 tablet, Rfl: 1  Allergies  Allergen Reactions  . Acetaminophen Itching and Swelling    Also notes BURNING  . Shellfish Allergy Itching    Itching of tongue and throat    I personally reviewed active problem list, medication list, allergies, notes from last encounter, lab results with the patient/caregiver today.  She has lab work from [facility] on her cell phone that we reviewed together - she will bring these in after she is able to print.   ROS Constitutional: Negative for fever or weight change.  Respiratory: Negative for cough and shortness of breath.   Cardiovascular: Negative for chest pain or palpitations.  Gastrointestinal: Negative for abdominal pain, no bowel changes.  Musculoskeletal: Negative for gait problem or joint swelling.  Skin: Negative for rash.  Neurological: Negative for dizziness or headache.  No other specific complaints in a complete review of systems (except as listed in HPI above).  Objective  Vitals:   12/04/17 1416  BP: 116/70  Pulse: 88  Resp: 16  Temp: 98.1 F (36.7 C)  TempSrc: Oral  SpO2: 96%  Weight: 161 lb 12.8 oz (73.4 kg)  Height: 5\' 6"  (1.676 m)   Body mass index is 26.12 kg/m.  Physical Exam Constitutional: Patient appears well-developed and well-nourished. No distress.  HENT: Head: Normocephalic and atraumatic. Ears: bilateral TMs with no erythema or effusion; Nose: Nose normal. Mouth/Throat: Oropharynx is clear and moist. No oropharyngeal exudate or tonsillar swelling.  Eyes: Conjunctivae and EOM are normal. No scleral icterus.  Pupils are equal, round, and reactive to light.  Neck: Normal range of motion. Neck supple. No JVD present. No thyromegaly present.  Cardiovascular: Normal  rate, regular rhythm and normal heart sounds.  No murmur heard. No BLE edema. Pulmonary/Chest: Effort normal and breath sounds normal. No respiratory distress. Musculoskeletal: Normal range of motion, no joint effusions. No gross deformities Neurological: Pt is alert and oriented to person, place, and time. No cranial nerve deficit. Coordination, balance, strength, speech and gait are normal.  Skin: Skin is warm and dry. No rash noted. No erythema.  Psychiatric: Patient has a normal mood and affect. behavior is normal. Judgment and thought content normal.   No results found for this or any  previous visit (from the past 72 hour(s)).  PHQ2/9: Depression screen PHQ 2/9 12/04/2017  Decreased Interest 0  Down, Depressed, Hopeless 0  PHQ - 2 Score 0  Altered sleeping 0  Tired, decreased energy 0  Change in appetite 0  Feeling bad or failure about yourself  0  Trouble concentrating 0  Moving slowly or fidgety/restless 0  Suicidal thoughts 0  PHQ-9 Score 0  Difficult doing work/chores Not difficult at all   Fall Risk: Fall Risk  12/04/2017  Falls in the past year? No   Assessment & Plan  1. Hypertension, unspecified type - DASH diet discussed in detail; though BP is normal today, she has had several elevated readings over the last few months at provider offices.  We will treat with low dose lisinopril, discussed orthostatic hypotension as well ans S&SX's of stroke. - lisinopril (PRINIVIL,ZESTRIL) 10 MG tablet; Take 1 tablet (10 mg total) by mouth daily.  Dispense: 30 tablet; Refill: 1 - Comprehensive Metabolic Panel (CMET); Future  2. Needs flu shot - Flu Vaccine QUAD 6+ mos PF IM (Fluarix Quad PF)  3. Need for Tdap vaccination - Tdap vaccine greater than or equal to 7yo IM  4. Overweight (BMI 25.0-29.9) - Discussed importance of 150 minutes of physical activity weekly, eat two servings of fish weekly, eat one serving of tree nuts ( cashews, pistachios, pecans, almonds.Marland Kitchen) every  other day, eat 6 servings of fruit/vegetables daily and drink plenty of water and avoid sweet beverages.  - She will fax labs from [place] to [place] tomorrow.  5. Need for hepatitis C screening test - Hepatitis C antibody; Future  6. Encounter for screening for HIV - HIV Antibody (routine testing w rflx); Future  7. Insomnia, unspecified type - Sleep hygiene  discussed in detail; Melatonin 1.5-3mg  at night x1-2 weeks if sleep hygiene tips are not working alone.  8. Encounter to establish care - Follow up in 2 weeks for bp check and repeat BMP; possible TSH as well depending on symptoms/BP  9. Migraine without status migrainosus, not intractable, unspecified migraine type - Migraine diet handout is provided; discussed triggers in detail; advised to continue follow up with Dr. Lannie Harris office.

## 2017-12-04 NOTE — Patient Instructions (Addendum)
Sleep Hygiene Tips 1) Get regular. One of the best ways to train your body to sleep well is to go to bed and get up at more or less the same time every day, even on weekends and days off! This regular rhythm will make you feel better and will give your body something to work from. 2) Sleep when sleepy. Only try to sleep when you actually feel tired or sleepy, rather than spending too much time awake in bed. 3) Get up & try again. If you haven't been able to get to sleep after about 20 minutes or more, get up and do something calming or boring until you feel sleepy, then return to bed and try again. Sit quietly on the couch with the lights off (bright light will tell your brain that it is time to wake up), or read something boring like the phone book. Avoid doing anything that is too stimulating or interesting, as this will wake you up even more. 4) Avoid caffeine & nicotine. It is best to avoid consuming any caffeine (in coffee, tea, cola drinks, chocolate, and some medications) or nicotine (cigarettes) for at least 4-6 hours before going to bed. These substances act as stimulants and interfere with the ability to fall asleep 5) Avoid alcohol. It is also best to avoid alcohol for at least 4-6 hours before going to bed. Many people believe that alcohol is relaxing and helps them to get to sleep at first, but it actually interrupts the quality of sleep. 6) Bed is for sleeping. Try not to use your bed for anything other than sleeping and sex, so that your body comes to associate bed with sleep. If you use bed as a place to watch TV, eat, read, work on your laptop, pay bills, and other things, your body will not learn this Connection. 7) No naps. It is best to avoid taking naps during the day, to make sure that you are tired at bedtime. If you can't make it through the day without a nap, make sure it is for less than an hour and before 3pm. 8) Sleep rituals. You can develop your  own rituals of things to remind your body that it is time to sleep - some people find it useful to do relaxing stretches or breathing exercises for 15 minutes before bed each night, or sit calmly with a cup of caffeine-free tea. 9) Bathtime. Having a hot bath 1-2 hours before bedtime can be useful, as it will raise your body temperature, causing you to feel sleepy as your body temperature drops again. Research shows that sleepiness is associated with a drop in body temperature. 10) No clock-watching. Many people who struggle with sleep tend to watch the clock too much. Frequently checking the clock during the night can wake you up (especially if you turn on the light to read the time) and reinforces negative thoughts such as "Oh no, look how late it is, I'll never get to sleep" or "it's so early, I have only slept for 5 hours, this is terrible." 11) Use a sleep diary. This worksheet can be a useful way of making sure you have the right facts about your sleep, rather than making assumptions. Because a diary involves watching the clock (see point 10) it is a good idea to only use it for two weeks to get an idea of what is going and then perhaps two months down the track to see how you are progressing. 12) Exercise. Regular exercise is  a good idea to help with good sleep, but try not to do strenuous exercise in the 4 hours before bedtime. Morning walks are a great way to start the day feeling refreshed! 13) Eat right. A healthy, balanced diet will help you to sleep well, but timing is important. Some people find that a very empty stomach at bedtime is distracting, so it can be useful to have a light snack, but a heavy meal soon before bed can also interrupt sleep. Some people recommend a warm glass of milk, which contains tryptophan, which acts as a natural sleep inducer. 14) The right space. It is very important that your bed and bedroom are quiet and comfortable for sleeping. A  cooler room with enough blankets to stay warm is best, and make sure you have curtains or an eyemask to block out early morning light and earplugs if there is noise outside your room. 15) Keep daytime routine the same. Even if you have a bad night sleep and are tired it is important that you try to keep your daytime activities the same as you had planned. That is, don't avoid activities because you feel tired. This can reinforce the insomnia.  Cholesterol: Your LDL is above normal.  The LDL is the "lousy" or bad cholesterol. Over time and in combination with inflammation and other factors, this contributes to plaque which in turn may lead to stroke and/or heart attack down the road.  Sometimes high LDL is primarily genetic, and people might be eating all the right foods but still have high numbers.  Other times, there is room for improvement in one's diet and eating healthier can bring this number down and potentially reduce one's risk of heart attack and/or stroke.  Your LDL level should be below 100. If you have diabetes or a possible heart problem, your LDL should be below 70.  Some strategies to focus on to help improve your LDL levels:  - Eat 20 to 30 grams of fiber every day.  - Eat Foods such as fruits and vegetables, whole grains, beans, peas, nuts, and seeds can help lower LDL. - Avoid Saturated fats - Dairy foods - such as butter, cream, ghee, regular-fat milk and cheese. Meat - such as fatty cuts of beef, pork and lamb, processed meats like salami, sausages and the skin on chicken. Lard., fatty snack foods, cakes, biscuits, pies and deep fried foods) - Avoid smoking    DASH Eating Plan DASH stands for "Dietary Approaches to Stop Hypertension." The DASH eating plan is a healthy eating plan that has been shown to reduce high blood pressure (hypertension). It may also reduce your risk for type 2 diabetes, heart disease, and stroke. The DASH eating plan may also help with weight  loss. What are tips for following this plan? General guidelines  Avoid eating more than 2,300 mg (milligrams) of salt (sodium) a day. If you have hypertension, you may need to reduce your sodium intake to 1,500 mg a day.  Limit alcohol intake to no more than 1 drink a day for nonpregnant women and 2 drinks a day for men. One drink equals 12 oz of beer, 5 oz of wine, or 1 oz of hard liquor.  Work with your health care provider to maintain a healthy body weight or to lose weight. Ask what an ideal weight is for you.  Get at least 30 minutes of exercise that causes your heart to beat faster (aerobic exercise) most days of the week. Activities may  include walking, swimming, or biking.  Work with your health care provider or diet and nutrition specialist (dietitian) to adjust your eating plan to your individual calorie needs. Reading food labels  Check food labels for the amount of sodium per serving. Choose foods with less than 5 percent of the Daily Value of sodium. Generally, foods with less than 300 mg of sodium per serving fit into this eating plan.  To find whole grains, look for the word "whole" as the first word in the ingredient list. Shopping  Buy products labeled as "low-sodium" or "no salt added."  Buy fresh foods. Avoid canned foods and premade or frozen meals. Cooking  Avoid adding salt when cooking. Use salt-free seasonings or herbs instead of table salt or sea salt. Check with your health care provider or pharmacist before using salt substitutes.  Do not fry foods. Cook foods using healthy methods such as baking, boiling, grilling, and broiling instead.  Cook with heart-healthy oils, such as olive, canola, soybean, or sunflower oil. Meal planning   Eat a balanced diet that includes: ? 5 or more servings of fruits and vegetables each day. At each meal, try to fill half of your plate with fruits and vegetables. ? Up to 6-8 servings of whole grains each day. ? Less than 6  oz of lean meat, poultry, or fish each day. A 3-oz serving of meat is about the same size as a deck of cards. One egg equals 1 oz. ? 2 servings of low-fat dairy each day. ? A serving of nuts, seeds, or beans 5 times each week. ? Heart-healthy fats. Healthy fats called Omega-3 fatty acids are found in foods such as flaxseeds and coldwater fish, like sardines, salmon, and mackerel.  Limit how much you eat of the following: ? Canned or prepackaged foods. ? Food that is high in trans fat, such as fried foods. ? Food that is high in saturated fat, such as fatty meat. ? Sweets, desserts, sugary drinks, and other foods with added sugar. ? Full-fat dairy products.  Do not salt foods before eating.  Try to eat at least 2 vegetarian meals each week.  Eat more home-cooked food and less restaurant, buffet, and fast food.  When eating at a restaurant, ask that your food be prepared with less salt or no salt, if possible. What foods are recommended? The items listed may not be a complete list. Talk with your dietitian about what dietary choices are best for you. Grains Whole-grain or whole-wheat bread. Whole-grain or whole-wheat pasta. Brown rice. [place]row. Bulgur. Whole-grain and low-sodium cereals. Pita bread. Low-fat, low-sodium crackers. Whole-wheat flour tortillas. Vegetables Fresh or frozen vegetables (raw, steamed, roasted, or grilled). Low-sodium or reduced-sodium tomato and vegetable juice. Low-sodium or reduced-sodium tomato sauce and tomato paste. Low-sodium or reduced-sodium canned vegetables. Fruits All fresh, dried, or frozen fruit. Canned fruit in natural juice (without added sugar). Meat and other protein foods Skinless chicken or [place]. Ground chicken or [place]. Pork with fat trimmed off. Fish and seafood. Egg whites. Dried beans, peas, or lentils. Unsalted nuts, nut butters, and seeds. Unsalted canned beans. Lean cuts of beef with fat trimmed off. Low-sodium, lean deli  meat. Dairy Low-fat (1%) or fat-free (skim) milk. Fat-free, low-fat, or reduced-fat cheeses. Nonfat, low-sodium ricotta or cottage cheese. Low-fat or nonfat yogurt. Low-fat, low-sodium cheese. Fats and oils Soft margarine without trans fats. Vegetable oil. Low-fat, reduced-fat, or light mayonnaise and salad dressings (reduced-sodium). Canola, safflower, olive, soybean, and sunflower oils. Avocado. Seasoning and  other foods Herbs. Spices. Seasoning mixes without salt. Unsalted popcorn and pretzels. Fat-free sweets. What foods are not recommended? The items listed may not be a complete list. Talk with your dietitian about what dietary choices are best for you. Grains Baked goods made with fat, such as croissants, muffins, or some breads. Dry pasta or rice meal packs. Vegetables Creamed or fried vegetables. Vegetables in a cheese sauce. Regular canned vegetables (not low-sodium or reduced-sodium). Regular canned tomato sauce and paste (not low-sodium or reduced-sodium). Regular tomato and vegetable juice (not low-sodium or reduced-sodium). Angie Fava. Olives. Fruits Canned fruit in a light or heavy syrup. Fried fruit. Fruit in cream or butter sauce. Meat and other protein foods Fatty cuts of meat. Ribs. Fried meat. Berniece Salines. Sausage. Bologna and other processed lunch meats. Salami. Fatback. Hotdogs. Bratwurst. Salted nuts and seeds. Canned beans with added salt. Canned or smoked fish. Whole eggs or egg yolks. Chicken or [place] with skin. Dairy Whole or 2% milk, cream, and half-and-half. Whole or full-fat cream cheese. Whole-fat or sweetened yogurt. Full-fat cheese. Nondairy creamers. Whipped toppings. Processed cheese and cheese spreads. Fats and oils Butter. Stick margarine. Lard. Shortening. Ghee. Bacon fat. Tropical oils, such as coconut, palm kernel, or palm oil. Seasoning and other foods Salted popcorn and pretzels. Onion salt, garlic salt, seasoned salt, table salt, and sea salt. Worcestershire  sauce. Tartar sauce. Barbecue sauce. Teriyaki sauce. Soy sauce, including reduced-sodium. Steak sauce. Canned and packaged gravies. Fish sauce. Oyster sauce. Cocktail sauce. Horseradish that you find on the shelf. Ketchup. Mustard. Meat flavorings and tenderizers. Bouillon cubes. Hot sauce and Tabasco sauce. Premade or packaged marinades. Premade or packaged taco seasonings. Relishes. Regular salad dressings. Where to find more information:  National Heart, Lung, and [place]: https://wilson-eaton.com/  American Heart Association: www.heart.org Summary  The DASH eating plan is a healthy eating plan that has been shown to reduce high blood pressure (hypertension). It may also reduce your risk for type 2 diabetes, heart disease, and stroke.  With the DASH eating plan, you should limit salt (sodium) intake to 2,300 mg a day. If you have hypertension, you may need to reduce your sodium intake to 1,500 mg a day.  When on the DASH eating plan, aim to eat more fresh fruits and vegetables, whole grains, lean proteins, low-fat dairy, and heart-healthy fats.  Work with your health care provider or diet and nutrition specialist (dietitian) to adjust your eating plan to your individual calorie needs. This information is not intended to replace advice given to you by your health care provider. Make sure you discuss any questions you have with your health care provider. Document Released: 01/13/2011 Document Revised: 01/18/2016 Document Reviewed: 01/18/2016 Elsevier Interactive Patient Education  [employer].

## 2017-12-14 ENCOUNTER — Encounter: Payer: 59 | Admitting: Obstetrics and Gynecology

## 2017-12-14 ENCOUNTER — Ambulatory Visit (INDEPENDENT_AMBULATORY_CARE_PROVIDER_SITE_OTHER): Payer: BLUE CROSS/BLUE SHIELD

## 2017-12-14 ENCOUNTER — Encounter: Payer: Self-pay | Admitting: Obstetrics and Gynecology

## 2017-12-14 ENCOUNTER — Other Ambulatory Visit: Payer: Self-pay | Admitting: Obstetrics and Gynecology

## 2017-12-14 ENCOUNTER — Ambulatory Visit (INDEPENDENT_AMBULATORY_CARE_PROVIDER_SITE_OTHER): Payer: BLUE CROSS/BLUE SHIELD | Admitting: Obstetrics and Gynecology

## 2017-12-14 VITALS — BP 137/85 | HR 91 | Ht 66.0 in | Wt 166.0 lb

## 2017-12-14 DIAGNOSIS — Z0000 Encounter for general adult medical examination without abnormal findings: Secondary | ICD-10-CM | POA: Diagnosis not present

## 2017-12-14 DIAGNOSIS — L900 Lichen sclerosus et atrophicus: Secondary | ICD-10-CM | POA: Diagnosis not present

## 2017-12-14 DIAGNOSIS — D250 Submucous leiomyoma of uterus: Secondary | ICD-10-CM | POA: Diagnosis not present

## 2017-12-14 DIAGNOSIS — N63 Unspecified lump in unspecified breast: Secondary | ICD-10-CM

## 2017-12-14 DIAGNOSIS — D219 Benign neoplasm of connective and other soft tissue, unspecified: Secondary | ICD-10-CM | POA: Diagnosis not present

## 2017-12-14 NOTE — Progress Notes (Signed)
HPI:      [name] is a 47 y.o. [id number] who LMP was Patient's last menstrual period was 12/05/2017 (exact date).  Subjective:   She presents today for follow-up of enlarged uterus noted at annual examination.  She also was begun on treatment for lichen sclerosus.   She reports that her menses are regular and monthly lasting 3 days but her 3 days are very heavy with significant clots and cramps.  She is not currently missing work she has just learned to "deal with it.  She says that she has been dealing with it for many years. She does report that the clobetasol has resulted in the resolution of her vulvar itching. She has not yet had her mammogram as she has had some difficulty getting old records from [facility].    Hx: The following portions of the patient's history were reviewed and updated as appropriate:             She  has no past medical history on file. She does not have any pertinent problems on file. She  has a past surgical history that includes Cesarean section and Tubal ligation (2010). Her family history includes Multiple sclerosis in her father; Stroke in her mother. She  reports that she has never smoked. She has never used smokeless tobacco. She reports that she drank alcohol. She reports that she does not use drugs. She has a current medication list which includes the following prescription(s): clobetasol cream, lisinopril, loratadine, nortriptyline, and rizatriptan. She is allergic to acetaminophen and shellfish allergy.       Review of Systems:  Review of Systems  Constitutional: Denied constitutional symptoms, night sweats, recent illness, fatigue, fever, insomnia and weight loss.  Eyes: Denied eye symptoms, eye pain, photophobia, vision change and visual disturbance.  Ears/Nose/Throat/Neck: Denied ear, nose, throat or neck symptoms, hearing loss, nasal discharge, sinus congestion and sore throat.  Cardiovascular: Denied cardiovascular symptoms, arrhythmia, chest  pain/pressure, edema, exercise intolerance, orthopnea and palpitations.  Respiratory: Denied pulmonary symptoms, asthma, pleuritic pain, productive sputum, cough, dyspnea and wheezing.  Gastrointestinal: Denied, gastro-esophageal reflux, melena, nausea and vomiting.  Genitourinary: Denied genitourinary symptoms including symptomatic vaginal discharge, pelvic relaxation issues, and urinary complaints.  Musculoskeletal: Denied musculoskeletal symptoms, stiffness, swelling, muscle weakness and myalgia.  Dermatologic: Denied dermatology symptoms, rash and scar.  Neurologic: Denied neurology symptoms, dizziness, headache, neck pain and syncope.  Psychiatric: Denied psychiatric symptoms, anxiety and depression.  Endocrine: Denied endocrine symptoms including hot flashes and night sweats.   Meds:   Current Outpatient Medications on File Prior to Visit  Medication Sig Dispense Refill  . clobetasol cream (TEMOVATE) 2.44 % Apply 1 application topically 2 (two) times daily. Use for 30 days twice a day as directed then use daily as directed. 60 g 2  . lisinopril (PRINIVIL,ZESTRIL) 10 MG tablet Take 1 tablet (10 mg total) by mouth daily. 30 tablet 1  . loratadine (CLARITIN) 10 MG tablet Take 10 mg by mouth daily as needed for allergies.    Marland Kitchen nortriptyline (PAMELOR) 10 MG capsule Take 10 mg by mouth 3 (three) times daily.    . rizatriptan (MAXALT) 10 MG tablet Take 10 mg by mouth as needed for migraine. May repeat in 2 hours if needed     No current facility-administered medications on file prior to visit.     Objective:     Vitals:   12/14/17 1006  BP: 137/85  Pulse: 91  Her ultrasound results from today were reviewed in detail with her.  These reveal multiple uterine fibroids and of significant note 1 of the fibroid is submucosal.  Assessment:    [id number] Patient Active Problem List   Diagnosis Date Noted  . Hypertension 12/04/2017  . Overweight (BMI 25.0-29.9) 12/04/2017  .  Migraine 12/04/2017  . Insomnia 12/04/2017  . Chronic tension-type headache, not intractable 12/02/2016  . Abnormal mammogram 05/14/2013     1. Fibroids, submucosal   2. Lichen sclerosus   3. Encounter for annual physical exam     Lichen sclerosus controlled with clobetasol  Fibroid uterus likely causing her symptoms of heavy menses and cramping.  Patient not sure which she would like to do for management.  She says that she is tired of dealing with all the problems associated with heavy bleeding and cramping and would consider surgical options.   Plan:            1.  I have recommended a continuation of clobetasol 2 times per week for maintenance.  2.  Patient would like to spend some time considering her options for management of uterine fibroids.  We have discussed the possibility of better cycle control using OCPs versus possible definitive surgery.  3.  The patient has again been referred for mammography screening. Orders Orders Placed This Encounter  Procedures  . MM DIGITAL SCREENING BILATERAL    No orders of the defined types were placed in this encounter.     F/U  Return for Pt to contact us if symptoms worsen. I spent 23 minutes involved in the care of this patient of which greater than 50% was spent discussing the natural course and history of uterine fibroids diagnosis management treatment options, lichen sclerosus.  Finis Bud, M.D. 12/14/2017 11:36 AM

## 2017-12-14 NOTE — Progress Notes (Signed)
Pt here to discuss ultrasound results. Pt requests a mammo referral. Pt would like to address discomfort in her left breast in which you previously discussed.

## 2017-12-18 ENCOUNTER — Telehealth: Payer: Self-pay | Admitting: Family Medicine

## 2017-12-18 ENCOUNTER — Encounter: Payer: Self-pay | Admitting: Family Medicine

## 2017-12-18 ENCOUNTER — Ambulatory Visit (INDEPENDENT_AMBULATORY_CARE_PROVIDER_SITE_OTHER): Payer: BLUE CROSS/BLUE SHIELD | Admitting: Family Medicine

## 2017-12-18 VITALS — BP 118/72 | HR 100 | Temp 98.2°F | Resp 14 | Ht 66.0 in | Wt 166.0 lb

## 2017-12-18 DIAGNOSIS — E663 Overweight: Secondary | ICD-10-CM

## 2017-12-18 DIAGNOSIS — Z13220 Encounter for screening for lipoid disorders: Secondary | ICD-10-CM | POA: Diagnosis not present

## 2017-12-18 DIAGNOSIS — I10 Essential (primary) hypertension: Secondary | ICD-10-CM | POA: Diagnosis not present

## 2017-12-18 MED ORDER — LISINOPRIL 10 MG PO TABS: 10.0000 mg | ORAL_TABLET | Freq: Every day | ORAL | 1 refills | Status: DC

## 2017-12-18 NOTE — Progress Notes (Signed)
Name: Shirley Harris   MRN: [id number]    DOB: 1970-05-22   Date:12/18/2017       Progress Note  Subjective  Chief Complaint  Chief Complaint  Patient presents with  . Follow-up    2 week recheck after starting BP medication    HPI  Pt presents for 2 week BP check --  She was diagnosed in the Spring of 2019 with HTN at [facility], has had elevated readings at [facility] 10/10/2019142/91, in August with [facility] check it was 141/90, and with Dr. Melrose Nakayama in April 2019 she was 140/102.  She was at goal in our clinic on 12/04/2017, howeer, Dr. Melrose Nakayama recommended she be started on BP medication to see if this helps with her chronic headaches. She was started on 10mg  Lisinopril daily.  She denies any worsening headaches, blurred vision, limb weakness, confusion, slurred speech, facial droop, lightheadedness/orthostatic lightheadedness, dizziness, near syncope. Discussed migraine diet in detail.  BP is at goal today. - Labs - she is needing lab work today - we will add TSH and lipid panel to already ordered CMP, Hep C, and HIV.  Patient Active Problem List   Diagnosis Date Noted  . Hypertension 12/04/2017  . Overweight (BMI 25.0-29.9) 12/04/2017  . Migraine 12/04/2017  . Insomnia 12/04/2017  . Chronic tension-type headache, not intractable 12/02/2016  . Abnormal mammogram 05/14/2013    Past Surgical History:  Procedure Laterality Date  . CESAREAN SECTION    . TUBAL LIGATION  2010    Family History  Problem Relation Age of Onset  . Stroke Mother   . Multiple sclerosis Father     Social History   Socioeconomic History  . Marital status: Married    Spouse name: [name]   . Number of children: 3  . Years of education: Not on file  . Highest education level: Not on file  Occupational History  . Not on file  Social Needs  . Financial resource strain: Not hard at all  . Food insecurity:    Worry: Never true    Inability: Never true  . Transportation needs:    Medical: No   Non-medical: No  Tobacco Use  . Smoking status: Never Smoker  . Smokeless tobacco: Never Used  Substance and Sexual Activity  . Alcohol use: Not Currently  . Drug use: Never  . Sexual activity: Yes    Partners: Male    Birth control/protection: Surgical  Lifestyle  . Physical activity:    Days per week: 2 days    Minutes per session: 30 min  . Stress: Only a little  Relationships  . Social connections:    Talks on phone: More than three times a week    Gets together: More than three times a week    Attends religious service: More than 4 times per year    Active member of club or organization: Yes    Attends meetings of clubs or organizations: More than 4 times per year    Relationship status: Married  . Intimate partner violence:    Fear of current or ex partner: No    Emotionally abused: No    Physically abused: No    Forced sexual activity: No  Other Topics Concern  . Not on file  Social History Narrative  . Not on file     Current Outpatient Medications:  .  lisinopril (PRINIVIL,ZESTRIL) 10 MG tablet, Take 1 tablet (10 mg total) by mouth daily., Disp: 30 tablet, Rfl: 1 .  loratadine (CLARITIN) 10 MG tablet, Take 10 mg by mouth daily as needed for allergies., Disp: , Rfl:  .  nortriptyline (PAMELOR) 10 MG capsule, Take 10 mg by mouth 3 (three) times daily., Disp: , Rfl:  .  rizatriptan (MAXALT) 10 MG tablet, Take 10 mg by mouth as needed for migraine. May repeat in 2 hours if needed, Disp: , Rfl:   Allergies  Allergen Reactions  . Acetaminophen Itching and Swelling    Also notes BURNING  . Shellfish Allergy Itching    Itching of tongue and throat    I personally reviewed active problem list, medication list, health maintenance, notes from last encounter, lab results with the patient/caregiver today.   ROS  Constitutional: Negative for fever or weight change.  Respiratory: Negative for cough and shortness of breath.   Cardiovascular: Negative for chest pain or  palpitations.  Gastrointestinal: Negative for abdominal pain, no bowel changes.  Musculoskeletal: Negative for gait problem or joint swelling.  Skin: Negative for rash.  Neurological: Negative for dizziness or headache.  No other specific complaints in a complete review of systems (except as listed in HPI above).  Objective  Vitals:   12/18/17 1249  BP: 118/72  Pulse: 100  Resp: 14  Temp: 98.2 F (36.8 C)  TempSrc: Oral  SpO2: 99%  Weight: 166 lb (75.3 kg)  Height: 5\' 6"  (1.676 m)   Body mass index is 26.79 kg/m.  Physical Exam  Constitutional: Patient appears well-developed and well-nourished. No distress.  HEENT: head atraumatic, normocephalic, pupils equal and reactive to light; no thyromegaly. Cardiovascular: Normal rate, regular rhythm and normal heart sounds.  No murmur heard. No BLE edema. Pulmonary/Chest: Effort normal and breath sounds clear bilaterally. No respiratory distress. Psychiatric: Patient has a normal mood and affect. behavior is normal. Judgment and thought content normal. Musculoskeletal: Normal range of motion, no joint effusions. No gross deformities Neurological: Pt is alert and oriented to person, place, and time. No cranial nerve deficit. Coordination, balance, strength, speech and gait are normal.  Skin: Skin is warm and dry. No rash noted. No erythema.  Psychiatric: Patient has a normal mood and affect. behavior is normal. Judgment and thought content normal.  No results found for this or any previous visit (from the past 72 hour(s)).  PHQ2/9: Depression screen [facility] 2/9 12/18/2017 12/04/2017  Decreased Interest 0 0  Down, Depressed, Hopeless 0 0  PHQ - 2 Score 0 0  Altered sleeping 0 0  Tired, decreased energy 0 0  Change in appetite 0 0  Feeling bad or failure about yourself  0 0  Trouble concentrating 0 0  Moving slowly or fidgety/restless 0 0  Suicidal thoughts 0 0  PHQ-9 Score 0 0  Difficult doing work/chores Not difficult at all Not  difficult at all   Fall Risk: Fall Risk  12/18/2017 12/04/2017  Falls in the past year? 0 No   Assessment & Plan  1. Hypertension, unspecified type - Stable, continue lisinopril 10mg  daily. - Lipid panel - TSH  2. Overweight (BMI 25.0-29.9) - Discussed importance of 150 minutes of physical activity weekly, eat two servings of fish weekly, eat one serving of tree nuts ( cashews, pistachios, pecans, almonds.Marland Kitchen) every other day, eat 6 servings of fruit/vegetables daily and drink plenty of water and avoid sweet beverages. - Lipid panel - TSH  3. Lipid screening - Lipid panel

## 2017-12-18 NOTE — Patient Instructions (Signed)
Your blood pressure goal is between 105/65 - 140/90 or if you are having symptoms - dizziness, lightheadedness, vision changes, worsening headaches/abnormal headaches, etc.  DASH Eating Plan DASH stands for "Dietary Approaches to Stop Hypertension." The DASH eating plan is a healthy eating plan that has been shown to reduce high blood pressure (hypertension). It may also reduce your risk for type 2 diabetes, heart disease, and stroke. The DASH eating plan may also help with weight loss. What are tips for following this plan? General guidelines  Avoid eating more than 2,300 mg (milligrams) of salt (sodium) a day. If you have hypertension, you may need to reduce your sodium intake to 1,500 mg a day.  Limit alcohol intake to no more than 1 drink a day for nonpregnant women and 2 drinks a day for men. One drink equals 12 oz of beer, 5 oz of wine, or 1 oz of hard liquor.  Work with your health care provider to maintain a healthy body weight or to lose weight. Ask what an ideal weight is for you.  Get at least 30 minutes of exercise that causes your heart to beat faster (aerobic exercise) most days of the week. Activities may include walking, swimming, or biking.  Work with your health care provider or diet and nutrition specialist (dietitian) to adjust your eating plan to your individual calorie needs. Reading food labels  Check food labels for the amount of sodium per serving. Choose foods with less than 5 percent of the Daily Value of sodium. Generally, foods with less than 300 mg of sodium per serving fit into this eating plan.  To find whole grains, look for the word "whole" as the first word in the ingredient list. Shopping  Buy products labeled as "low-sodium" or "no salt added."  Buy fresh foods. Avoid canned foods and premade or frozen meals. Cooking  Avoid adding salt when cooking. Use salt-free seasonings or herbs instead of table salt or sea salt. Check with your health care  provider or pharmacist before using salt substitutes.  Do not fry foods. Cook foods using healthy methods such as baking, boiling, grilling, and broiling instead.  Cook with heart-healthy oils, such as olive, canola, soybean, or sunflower oil. Meal planning   Eat a balanced diet that includes: ? 5 or more servings of fruits and vegetables each day. At each meal, try to fill half of your plate with fruits and vegetables. ? Up to 6-8 servings of whole grains each day. ? Less than 6 oz of lean meat, poultry, or fish each day. A 3-oz serving of meat is about the same size as a deck of cards. One egg equals 1 oz. ? 2 servings of low-fat dairy each day. ? A serving of nuts, seeds, or beans 5 times each week. ? Heart-healthy fats. Healthy fats called Omega-3 fatty acids are found in foods such as flaxseeds and coldwater fish, like sardines, salmon, and mackerel.  Limit how much you eat of the following: ? Canned or prepackaged foods. ? Food that is high in trans fat, such as fried foods. ? Food that is high in saturated fat, such as fatty meat. ? Sweets, desserts, sugary drinks, and other foods with added sugar. ? Full-fat dairy products.  Do not salt foods before eating.  Try to eat at least 2 vegetarian meals each week.  Eat more home-cooked food and less restaurant, buffet, and fast food.  When eating at a restaurant, ask that your food be prepared with less  salt or no salt, if possible. What foods are recommended? The items listed may not be a complete list. Talk with your dietitian about what dietary choices are best for you. Grains Whole-grain or whole-wheat bread. Whole-grain or whole-wheat pasta. Brown rice. Modena Morrow. Bulgur. Whole-grain and low-sodium cereals. Pita bread. Low-fat, low-sodium crackers. Whole-wheat flour tortillas. Vegetables Fresh or frozen vegetables (raw, steamed, roasted, or grilled). Low-sodium or reduced-sodium tomato and vegetable juice. Low-sodium or  reduced-sodium tomato sauce and tomato paste. Low-sodium or reduced-sodium canned vegetables. Fruits All fresh, dried, or frozen fruit. Canned fruit in natural juice (without added sugar). Meat and other protein foods Skinless chicken or [place]. Ground chicken or [place]. Pork with fat trimmed off. Fish and seafood. Egg whites. Dried beans, peas, or lentils. Unsalted nuts, nut butters, and seeds. Unsalted canned beans. Lean cuts of beef with fat trimmed off. Low-sodium, lean deli meat. Dairy Low-fat (1%) or fat-free (skim) milk. Fat-free, low-fat, or reduced-fat cheeses. Nonfat, low-sodium ricotta or cottage cheese. Low-fat or nonfat yogurt. Low-fat, low-sodium cheese. Fats and oils Soft margarine without trans fats. Vegetable oil. Low-fat, reduced-fat, or light mayonnaise and salad dressings (reduced-sodium). Canola, safflower, olive, soybean, and sunflower oils. Avocado. Seasoning and other foods Herbs. Spices. Seasoning mixes without salt. Unsalted popcorn and pretzels. Fat-free sweets. What foods are not recommended? The items listed may not be a complete list. Talk with your dietitian about what dietary choices are best for you. Grains Baked goods made with fat, such as croissants, muffins, or some breads. Dry pasta or rice meal packs. Vegetables Creamed or fried vegetables. Vegetables in a cheese sauce. Regular canned vegetables (not low-sodium or reduced-sodium). Regular canned tomato sauce and paste (not low-sodium or reduced-sodium). Regular tomato and vegetable juice (not low-sodium or reduced-sodium). Angie Fava. Olives. Fruits Canned fruit in a light or heavy syrup. Fried fruit. Fruit in cream or butter sauce. Meat and other protein foods Fatty cuts of meat. Ribs. Fried meat. Berniece Salines. Sausage. Bologna and other processed lunch meats. Salami. Fatback. Hotdogs. Bratwurst. Salted nuts and seeds. Canned beans with added salt. Canned or smoked fish. Whole eggs or egg yolks. Chicken or [place]  with skin. Dairy Whole or 2% milk, cream, and half-and-half. Whole or full-fat cream cheese. Whole-fat or sweetened yogurt. Full-fat cheese. Nondairy creamers. Whipped toppings. Processed cheese and cheese spreads. Fats and oils Butter. Stick margarine. Lard. Shortening. Ghee. Bacon fat. Tropical oils, such as coconut, palm kernel, or palm oil. Seasoning and other foods Salted popcorn and pretzels. Onion salt, garlic salt, seasoned salt, table salt, and sea salt. Worcestershire sauce. Tartar sauce. Barbecue sauce. Teriyaki sauce. Soy sauce, including reduced-sodium. Steak sauce. Canned and packaged gravies. Fish sauce. Oyster sauce. Cocktail sauce. Horseradish that you find on the shelf. Ketchup. Mustard. Meat flavorings and tenderizers. Bouillon cubes. Hot sauce and Tabasco sauce. Premade or packaged marinades. Premade or packaged taco seasonings. Relishes. Regular salad dressings. Where to find more information:  National Heart, Lung, and [place]: https://wilson-eaton.com/  American Heart Association: www.heart.org Summary  The DASH eating plan is a healthy eating plan that has been shown to reduce high blood pressure (hypertension). It may also reduce your risk for type 2 diabetes, heart disease, and stroke.  With the DASH eating plan, you should limit salt (sodium) intake to 2,300 mg a day. If you have hypertension, you may need to reduce your sodium intake to 1,500 mg a day.  When on the DASH eating plan, aim to eat more fresh fruits and vegetables, whole grains, lean proteins, low-fat  dairy, and heart-healthy fats.  Work with your health care provider or diet and nutrition specialist (dietitian) to adjust your eating plan to your individual calorie needs. This information is not intended to replace advice given to you by your health care provider. Make sure you discuss any questions you have with your health care provider. Document Released: 01/13/2011 Document Revised: 01/18/2016  Document Reviewed: 01/18/2016 Elsevier Interactive Patient Education  [employer].

## 2017-12-18 NOTE — Telephone Encounter (Signed)
The patient called and stated that she would like to speak with Amber Dr. Amalia Hailey nurse in regards to her wanting to know what hysterectomy dates are available this year. The patient was made aware that Dr. Amalia Hailey and his nurse are not in the office and will receive a call back more than likely tomorrow. Please advise.

## 2017-12-19 ENCOUNTER — Telehealth: Payer: Self-pay

## 2017-12-19 DIAGNOSIS — R51 Headache: Secondary | ICD-10-CM | POA: Diagnosis not present

## 2017-12-19 NOTE — Telephone Encounter (Signed)
Telephone encounter created for Shirley Harris should have been created for Dr. Jeannie Fend. Please see next Telephone Encounter for correct information.

## 2017-12-19 NOTE — Telephone Encounter (Signed)
Returned pt call regarding surgery dates. Informed pt that surgery dates have not been scheduled yet and she would need to discuss with DJE at her pre-op appt once she schedules it. Pt wants hyster. Before the end of the year. (Corrected Telephone Encounter)

## 2017-12-27 ENCOUNTER — Ambulatory Visit
Admission: RE | Admit: 2017-12-27 | Discharge: 2017-12-27 | Disposition: A | Discharge status: Home / Self Care | Payer: BLUE CROSS/BLUE SHIELD | Source: Ambulatory Visit | Attending: Obstetrics and Gynecology | Admitting: Obstetrics and Gynecology

## 2017-12-27 ENCOUNTER — Encounter: Payer: Self-pay | Admitting: Obstetrics and Gynecology

## 2017-12-27 ENCOUNTER — Ambulatory Visit (INDEPENDENT_AMBULATORY_CARE_PROVIDER_SITE_OTHER): Payer: BLUE CROSS/BLUE SHIELD | Admitting: Obstetrics and Gynecology

## 2017-12-27 VITALS — BP 124/85 | HR 109 | Ht 66.0 in | Wt 166.0 lb

## 2017-12-27 DIAGNOSIS — D250 Submucous leiomyoma of uterus: Secondary | ICD-10-CM | POA: Diagnosis not present

## 2017-12-27 DIAGNOSIS — N6489 Other specified disorders of breast: Secondary | ICD-10-CM | POA: Diagnosis not present

## 2017-12-27 DIAGNOSIS — N938 Other specified abnormal uterine and vaginal bleeding: Secondary | ICD-10-CM

## 2017-12-27 DIAGNOSIS — N63 Unspecified lump in unspecified breast: Secondary | ICD-10-CM

## 2017-12-27 DIAGNOSIS — N946 Dysmenorrhea, unspecified: Secondary | ICD-10-CM | POA: Diagnosis not present

## 2017-12-27 DIAGNOSIS — Z01818 Encounter for other preprocedural examination: Secondary | ICD-10-CM

## 2017-12-27 DIAGNOSIS — R922 Inconclusive mammogram: Secondary | ICD-10-CM | POA: Diagnosis not present

## 2017-12-27 MED ORDER — METRONIDAZOLE 500 MG PO TABS: 500.0000 mg | ORAL_TABLET | Freq: Two times a day (BID) | ORAL | 0 refills | Status: DC

## 2017-12-27 NOTE — H&P (Signed)
PRE-OPERATIVE HISTORY AND PHYSICAL EXAM  PCP:  Hubbard Hartshorn, FNP Subjective:   HPI:  Shirley Harris is a 47 y.o. [phone].  Patient's last menstrual period was 12/05/2017 (exact date).  She presents today for a pre-op discussion and PE.  She has the following symptoms: Heavy menstrual bleeding, significant pelvic pain especially with menses.  She has large uterine fibroids, one of which is submucosal.  Review of Systems:   Constitutional: Denied constitutional symptoms, night sweats, recent illness, fatigue, fever, insomnia and weight loss.  Eyes: Denied eye symptoms, eye pain, photophobia, vision change and visual disturbance.  Ears/Nose/Throat/Neck: Denied ear, nose, throat or neck symptoms, hearing loss, nasal discharge, sinus congestion and sore throat.  Cardiovascular: Denied cardiovascular symptoms, arrhythmia, chest pain/pressure, edema, exercise intolerance, orthopnea and palpitations.  Respiratory: Denied pulmonary symptoms, asthma, pleuritic pain, productive sputum, cough, dyspnea and wheezing.  Gastrointestinal: Denied, gastro-esophageal reflux, melena, nausea and vomiting.  Genitourinary: See HPI for additional information.  Musculoskeletal: Denied musculoskeletal symptoms, stiffness, swelling, muscle weakness and myalgia.  Dermatologic: Denied dermatology symptoms, rash and scar.  Neurologic: Denied neurology symptoms, dizziness, headache, neck pain and syncope.  Psychiatric: Denied psychiatric symptoms, anxiety and depression.  Endocrine: Denied endocrine symptoms including hot flashes and night sweats.   OB History  Gravida Para Term Preterm AB Living  5 3 2 1 2 3   SAB TAB Ectopic Multiple Live Births  0 0 0 0 0    # Outcome Date GA Lbr Len/2nd Weight Sex Delivery Anes PTL Lv  5 Term           4 Term           3 Preterm           2 AB           1 AB             History reviewed. No pertinent past medical history.  Past Surgical History:  Procedure  Laterality Date  . BREAST BIOPSY Right 2015   Benign breast tissue with cystic and papillary apically metaplasia  . CESAREAN SECTION    . TUBAL LIGATION  2010      SOCIAL HISTORY:  Social History   Tobacco Use  Smoking Status Never Smoker  Smokeless Tobacco Never Used   Social History   Substance and Sexual Activity  Alcohol Use Not Currently   Substance and Sexual Activity  Alcohol Use Not Currently     Substance and Sexual Activity  Drug Use Never    Family History  Problem Relation Age of Onset  . Stroke Mother   . Multiple sclerosis Father   . Breast cancer Neg Hx     ALLERGIES:  Acetaminophen and Shellfish allergy  MEDS:   Current Outpatient Medications on File Prior to Visit  Medication Sig Dispense Refill  . lisinopril (PRINIVIL,ZESTRIL) 10 MG tablet Take 1 tablet (10 mg total) by mouth daily. 90 tablet 1  . loratadine (CLARITIN) 10 MG tablet Take 10 mg by mouth daily as needed for allergies.    Marland Kitchen nortriptyline (PAMELOR) 10 MG capsule Take 10 mg by mouth 3 (three) times daily.    . rizatriptan (MAXALT) 10 MG tablet Take 10 mg by mouth as needed for migraine. May repeat in 2 hours if needed     No current facility-administered medications on file prior to visit.     Meds ordered this encounter  Medications  . metroNIDAZOLE (FLAGYL) 500 MG tablet  Sig: Take 1 tablet (500 mg total) by mouth 2 (two) times daily. Begin 5 days prior to scheduled surgery as directed.    Dispense:  10 tablet    Refill:  0     Physical examination BP 124/85   Pulse (!) 109   Ht 5\' 6"  (1.676 m)   Wt 166 lb (75.3 kg)   LMP 12/05/2017 (Exact Date)   BMI 26.79 kg/m   General NAD, Conversant  HEENT Atraumatic; Op clear with mmm.  Normo-cephalic. Pupils reactive. Anicteric sclerae  Thyroid/Neck Smooth without nodularity or enlargement. Normal ROM.  Neck Supple.  Skin No rashes, lesions or ulceration. Normal palpated skin turgor. No nodularity.  Breasts: No masses or  discharge.  Symmetric.  No axillary adenopathy.  Lungs: Clear to auscultation.No rales or wheezes. Normal Respiratory effort, no retractions.  Heart: NSR.  No murmurs or rubs appreciated. No periferal edema  Abdomen: Soft.  Non-tender.  No masses.  No HSM. No hernia  Extremities: Moves all appropriately.  Normal ROM for age. No lymphadenopathy.  Neuro: Oriented to PPT.  Normal mood. Normal affect.     Pelvic:   Vulva: Normal appearance.  No lesions.  Vagina: No lesions or abnormalities noted.  Support: Normal pelvic support.  Urethra No masses tenderness or scarring.  Meatus Normal size without lesions or prolapse.  Cervix: Normal ectropion.  No lesions.  Anus: Normal exam.  No lesions.  Perineum: Normal exam.  No lesions.        Bimanual   Uterus:  12 weeks size non-tender.  Mobile.  AV.  Adnexae: No masses.  Non-tender to palpation.  Cul-de-sac: Negative for abnormality.   U/S: The uterus measures 10.7 x 6.5 x 5.2 cm. Echo texture is heterogeneous with evidence of focal masses. Within the uterus are multiple suspected fibroids measuring: Fibroid 1: Anterior LUS, Intramural/Subserosal, 2.1 x 2.1 x 1.9 cm Fibroid 2: Posterior Left, Intramural/Subserosal, 2.6 x 2.4 x 2.0 cm Fibroid 3: Anterior Right, Intramural/Subserosal, 2.4 x 2.6 x 2.7 cm (Possibly abutting the endometrium) Fibroid 4: Within the endometrium, Submucosal, 1.1 x 1.1 x 1.1 cm  The Endometrium measures 13-14 mm.  Assessment:   [id number] Patient Active Problem List   Diagnosis Date Noted  . Hypertension 12/04/2017  . Overweight (BMI 25.0-29.9) 12/04/2017  . Migraine 12/04/2017  . Insomnia 12/04/2017  . Chronic tension-type headache, not intractable 12/02/2016  . Abnormal mammogram 05/14/2013    1. Preop examination   2. Dysfunctional uterine bleeding   3. Fibroids, submucosal   4. Dysmenorrhea      Plan:   Orders: Meds ordered this encounter  Medications  . metroNIDAZOLE (FLAGYL) 500 MG tablet     Sig: Take 1 tablet (500 mg total) by mouth 2 (two) times daily. Begin 5 days prior to scheduled surgery as directed.    Dispense:  10 tablet    Refill:  0    She has decided upon definitive management for her heavy bleeding dysmenorrhea and pelvic pain.  We have discussed hormonal methods of control, endometrial ablation, and embolization of fibroids.  She has decided upon surgery. 1.  laparoscopic assisted vaginal hysterectomy   BSO

## 2017-12-27 NOTE — Progress Notes (Signed)
Pt presents today for pre op exam. Pt is doing well, but a little nervous.

## 2017-12-27 NOTE — Progress Notes (Signed)
PRE-OPERATIVE HISTORY AND PHYSICAL EXAM  PCP:  Hubbard Hartshorn, FNP Subjective:   HPI:  Shirley Harris is a 47 y.o. [phone].  Patient's last menstrual period was 12/05/2017 (exact date).  She presents today for a pre-op discussion and PE.  She has the following symptoms: Heavy menstrual bleeding, significant pelvic pain especially with menses.  She has large uterine fibroids, one of which is submucosal.  Review of Systems:   Constitutional: Denied constitutional symptoms, night sweats, recent illness, fatigue, fever, insomnia and weight loss.  Eyes: Denied eye symptoms, eye pain, photophobia, vision change and visual disturbance.  Ears/Nose/Throat/Neck: Denied ear, nose, throat or neck symptoms, hearing loss, nasal discharge, sinus congestion and sore throat.  Cardiovascular: Denied cardiovascular symptoms, arrhythmia, chest pain/pressure, edema, exercise intolerance, orthopnea and palpitations.  Respiratory: Denied pulmonary symptoms, asthma, pleuritic pain, productive sputum, cough, dyspnea and wheezing.  Gastrointestinal: Denied, gastro-esophageal reflux, melena, nausea and vomiting.  Genitourinary: See HPI for additional information.  Musculoskeletal: Denied musculoskeletal symptoms, stiffness, swelling, muscle weakness and myalgia.  Dermatologic: Denied dermatology symptoms, rash and scar.  Neurologic: Denied neurology symptoms, dizziness, headache, neck pain and syncope.  Psychiatric: Denied psychiatric symptoms, anxiety and depression.  Endocrine: Denied endocrine symptoms including hot flashes and night sweats.   OB History  Gravida Para Term Preterm AB Living  5 3 2 1 2 3   SAB TAB Ectopic Multiple Live Births  0 0 0 0 0    # Outcome Date GA Lbr Len/2nd Weight Sex Delivery Anes PTL Lv  5 Term           4 Term           3 Preterm           2 AB           1 AB             History reviewed. No pertinent past medical history.  Past Surgical History:  Procedure  Laterality Date  . BREAST BIOPSY Right 2015   Benign breast tissue with cystic and papillary apically metaplasia  . CESAREAN SECTION    . TUBAL LIGATION  2010      SOCIAL HISTORY:  Social History   Tobacco Use  Smoking Status Never Smoker  Smokeless Tobacco Never Used   Social History   Substance and Sexual Activity  Alcohol Use Not Currently   Substance and Sexual Activity  Alcohol Use Not Currently     Substance and Sexual Activity  Drug Use Never    Family History  Problem Relation Age of Onset  . Stroke Mother   . Multiple sclerosis Father   . Breast cancer Neg Hx     ALLERGIES:  Acetaminophen and Shellfish allergy  MEDS:   Current Outpatient Medications on File Prior to Visit  Medication Sig Dispense Refill  . lisinopril (PRINIVIL,ZESTRIL) 10 MG tablet Take 1 tablet (10 mg total) by mouth daily. 90 tablet 1  . loratadine (CLARITIN) 10 MG tablet Take 10 mg by mouth daily as needed for allergies.    Marland Kitchen nortriptyline (PAMELOR) 10 MG capsule Take 10 mg by mouth 3 (three) times daily.    . rizatriptan (MAXALT) 10 MG tablet Take 10 mg by mouth as needed for migraine. May repeat in 2 hours if needed     No current facility-administered medications on file prior to visit.     Meds ordered this encounter  Medications  . metroNIDAZOLE (FLAGYL) 500 MG tablet  Sig: Take 1 tablet (500 mg total) by mouth 2 (two) times daily. Begin 5 days prior to scheduled surgery as directed.    Dispense:  10 tablet    Refill:  0     Physical examination BP 124/85   Pulse (!) 109   Ht 5\' 6"  (1.676 m)   Wt 166 lb (75.3 kg)   LMP 12/05/2017 (Exact Date)   BMI 26.79 kg/m    General NAD, Conversant  HEENT Atraumatic; Op clear with mmm.  Normo-cephalic. Pupils reactive. Anicteric sclerae  Thyroid/Neck Smooth without nodularity or enlargement. Normal ROM.  Neck Supple.  Skin No rashes, lesions or ulceration. Normal palpated skin turgor. No nodularity.  Breasts: No masses or  discharge.  Symmetric.  No axillary adenopathy.  Lungs: Clear to auscultation.No rales or wheezes. Normal Respiratory effort, no retractions.  Heart: NSR.  No murmurs or rubs appreciated. No periferal edema  Abdomen: Soft.  Non-tender.  No masses.  No HSM. No hernia  Extremities: Moves all appropriately.  Normal ROM for age. No lymphadenopathy.  Neuro: Oriented to PPT.  Normal mood. Normal affect.     Pelvic:   Vulva: Normal appearance.  No lesions.  Vagina: No lesions or abnormalities noted.  Support: Normal pelvic support.  Urethra No masses tenderness or scarring.  Meatus Normal size without lesions or prolapse.  Cervix: Normal ectropion.  No lesions.  Anus: Normal exam.  No lesions.  Perineum: Normal exam.  No lesions.        Bimanual   Uterus:  12 weeks size non-tender.  Mobile.  AV.  Adnexae: No masses.  Non-tender to palpation.  Cul-de-sac: Negative for abnormality.   U/S: The uterus measures 10.7 x 6.5 x 5.2 cm. Echo texture is heterogeneous with evidence of focal masses. Within the uterus are multiple suspected fibroids measuring: Fibroid 1: Anterior LUS, Intramural/Subserosal, 2.1 x 2.1 x 1.9 cm Fibroid 2: Posterior Left, Intramural/Subserosal, 2.6 x 2.4 x 2.0 cm Fibroid 3: Anterior Right, Intramural/Subserosal, 2.4 x 2.6 x 2.7 cm (Possibly abutting the endometrium) Fibroid 4: Within the endometrium, Submucosal, 1.1 x 1.1 x 1.1 cm  The Endometrium measures 13-14 mm.  Assessment:   [id number] Patient Active Problem List   Diagnosis Date Noted  . Hypertension 12/04/2017  . Overweight (BMI 25.0-29.9) 12/04/2017  . Migraine 12/04/2017  . Insomnia 12/04/2017  . Chronic tension-type headache, not intractable 12/02/2016  . Abnormal mammogram 05/14/2013    1. Preop examination   2. Dysfunctional uterine bleeding   3. Fibroids, submucosal   4. Dysmenorrhea      Plan:   Orders: Meds ordered this encounter  Medications  . metroNIDAZOLE (FLAGYL) 500 MG tablet     Sig: Take 1 tablet (500 mg total) by mouth 2 (two) times daily. Begin 5 days prior to scheduled surgery as directed.    Dispense:  10 tablet    Refill:  0    She has decided upon definitive management for her heavy bleeding dysmenorrhea and pelvic pain.  We have discussed hormonal methods of control, endometrial ablation, and embolization of fibroids.  She has decided upon surgery. 1.  laparoscopic assisted vaginal hysterectomy   BSO  Pre-op discussions regarding Risks and Benefits of her scheduled surgery. LAVH The procedure of Laparoscopic Assisted Vaginal Hysterectomy was described to the patient in detail.  We reviewed the rationale for Hysterectomy and the patient was again informed of other nonsurgical management possibilities for her condition.  She has considered these other options, and desires a Hysterectomy.  We have reviewed the fact that Hysterectomy is permanent and that following the procedure she will not be able to become pregnant or bear children.  We have discussed the following risk factors specifically and the patient has also been informed that additional complications not mentioned may develop:  Damage to bowel, bladder, ureters or to other internal organs, bleeding, infection and the risk from anesthesia.  We have discussed the procedure itself in detail and she has an informed understanding of this surgery.  We have also discussed the recovery period in which physical and sexual activity will be restricted for a varying degree of time, often 3 - 6 weeks. The Laparoscopic Portion of Hysterectomy has also been reviewed with the patient.  She understands how the laparoscope facilitates the procedure.  We have discussed the abdominal incisions and punctures that will be used.  We have also reviewed the increased Operating Room time often accompanying LAVH.  The slightly increased risk of complications secondary to abdominal punctures, and use of laparoscopic instrumentation has also  been discussed in detail.I have answered all of her questions and I believe the patient has an informed understanding of the procedure of Laparoscopic Assisted Vaginal Hysterectomy.  Oophorectomy The option of Oophorectomy has been discussed with the patient.  Detailed risk/benefits have been reviewed.  The risks discussed include, but are not limited to, hemorrhage, infection, damage to ureter or other internal organ, and Ovarian Remnant Syndrome.  The benefits include a significant decrease in the risk of Ovarian Cancer and in benign Ovarian disease.  The risk of Ovarian CA has been estimated at 1 in 72.  This is a relatively small risk.  However, should Ovarian CA develop, it is often found late in the course of the disease.  We have also discussed the role of inheritance in the development of Ovarian disease.  Some women, who have close relatives with Ovarian CA, have a higher than 1 in 70 risk of Ovarian CA.  The benefits of Estrogen replacement therapy following Oophorectomy has been stressed.  If she is premenopausal, we have discussed the fact that this procedure will make her permanently sterile and that premature menopause will result if no ERT is begun.  I have answered all of her questions, and I believe that she has an adequate and informed understanding of the risks and benefits of Oophorectomy. She has decided upon bilateral oophorectomy  Finis Bud, M.D. 12/27/2017 1:49 PM I spent 32 minutes involved in the care of this patient of which greater than 50% was spent discussing risks and benefits of surgery, different options other than surgery, hospital stay, return to work, restrictions.  All questions answered.

## 2018-01-01 ENCOUNTER — Telehealth: Payer: Self-pay

## 2018-01-01 NOTE — Telephone Encounter (Signed)
Informed pt that mammogram detected no abnormal masses.

## 2018-01-18 DIAGNOSIS — Z13220 Encounter for screening for lipoid disorders: Secondary | ICD-10-CM | POA: Diagnosis not present

## 2018-01-18 DIAGNOSIS — I10 Essential (primary) hypertension: Secondary | ICD-10-CM | POA: Diagnosis not present

## 2018-01-18 DIAGNOSIS — E663 Overweight: Secondary | ICD-10-CM | POA: Diagnosis not present

## 2018-01-19 LAB — TSH: TSH: 1.51 u[IU]/mL (ref 0.450–4.500)

## 2018-01-19 LAB — LIPID PANEL
CHOLESTEROL TOTAL: 212 mg/dL — AB (ref 100–199)
Chol/HDL Ratio: 2.9 ratio (ref 0.0–4.4)
HDL: 74 mg/dL (ref >39)
LDL Calculated: 125 mg/dL — ABNORMAL HIGH (ref 0–99)
Triglycerides: 67 mg/dL (ref 0–149)
VLDL CHOLESTEROL CAL: 13 mg/dL (ref 5–40)

## 2018-01-29 ENCOUNTER — Ambulatory Visit: Payer: BLUE CROSS/BLUE SHIELD | Admitting: Registered Nurse

## 2018-01-29 ENCOUNTER — Observation Stay
Admission: RE | Admit: 2018-01-29 | Discharge: 2018-01-30 | Disposition: A | Discharge status: Home / Self Care | Payer: BLUE CROSS/BLUE SHIELD | Attending: Obstetrics and Gynecology | Admitting: Obstetrics and Gynecology

## 2018-01-29 ENCOUNTER — Encounter: Payer: Self-pay | Admitting: *Deleted

## 2018-01-29 ENCOUNTER — Encounter
Admission: RE | Disposition: A | Discharge status: Home / Self Care | Payer: Self-pay | Source: Home / Self Care | Attending: Obstetrics and Gynecology

## 2018-01-29 ENCOUNTER — Other Ambulatory Visit: Payer: BLUE CROSS/BLUE SHIELD

## 2018-01-29 ENCOUNTER — Inpatient Hospital Stay: Admission: RE | Admit: 2018-01-29 | Payer: BLUE CROSS/BLUE SHIELD | Source: Ambulatory Visit

## 2018-01-29 ENCOUNTER — Other Ambulatory Visit: Payer: Self-pay

## 2018-01-29 DIAGNOSIS — N946 Dysmenorrhea, unspecified: Secondary | ICD-10-CM | POA: Insufficient documentation

## 2018-01-29 DIAGNOSIS — N711 Chronic inflammatory disease of uterus: Secondary | ICD-10-CM | POA: Insufficient documentation

## 2018-01-29 DIAGNOSIS — D259 Leiomyoma of uterus, unspecified: Secondary | ICD-10-CM | POA: Diagnosis not present

## 2018-01-29 DIAGNOSIS — Z886 Allergy status to analgesic agent status: Secondary | ICD-10-CM | POA: Diagnosis not present

## 2018-01-29 DIAGNOSIS — N809 Endometriosis, unspecified: Secondary | ICD-10-CM | POA: Diagnosis not present

## 2018-01-29 DIAGNOSIS — G43909 Migraine, unspecified, not intractable, without status migrainosus: Secondary | ICD-10-CM | POA: Diagnosis not present

## 2018-01-29 DIAGNOSIS — N72 Inflammatory disease of cervix uteri: Secondary | ICD-10-CM | POA: Diagnosis not present

## 2018-01-29 DIAGNOSIS — I10 Essential (primary) hypertension: Secondary | ICD-10-CM | POA: Insufficient documentation

## 2018-01-29 DIAGNOSIS — D250 Submucous leiomyoma of uterus: Secondary | ICD-10-CM | POA: Diagnosis not present

## 2018-01-29 DIAGNOSIS — N938 Other specified abnormal uterine and vaginal bleeding: Secondary | ICD-10-CM | POA: Insufficient documentation

## 2018-01-29 DIAGNOSIS — D251 Intramural leiomyoma of uterus: Secondary | ICD-10-CM | POA: Diagnosis not present

## 2018-01-29 DIAGNOSIS — N736 Female pelvic peritoneal adhesions (postinfective): Secondary | ICD-10-CM | POA: Insufficient documentation

## 2018-01-29 DIAGNOSIS — Z91013 Allergy to seafood: Secondary | ICD-10-CM | POA: Insufficient documentation

## 2018-01-29 DIAGNOSIS — Z79899 Other long term (current) drug therapy: Secondary | ICD-10-CM | POA: Diagnosis not present

## 2018-01-29 DIAGNOSIS — N719 Inflammatory disease of uterus, unspecified: Secondary | ICD-10-CM | POA: Diagnosis not present

## 2018-01-29 DIAGNOSIS — N838 Other noninflammatory disorders of ovary, fallopian tube and broad ligament: Secondary | ICD-10-CM | POA: Diagnosis not present

## 2018-01-29 DIAGNOSIS — N939 Abnormal uterine and vaginal bleeding, unspecified: Secondary | ICD-10-CM | POA: Diagnosis not present

## 2018-01-29 HISTORY — PX: LAPAROSCOPIC VAGINAL HYSTERECTOMY WITH SALPINGECTOMY: SHX6680

## 2018-01-29 LAB — CBC
HCT: 39.7 % (ref 36.0–46.0)
Hemoglobin: 12.8 g/dL (ref 12.0–15.0)
MCH: 28.3 pg (ref 26.0–34.0)
MCHC: 32.2 g/dL (ref 30.0–36.0)
MCV: 87.8 fL (ref 80.0–100.0)
Platelets: 346 10*3/uL (ref 150–400)
RBC: 4.52 MIL/uL (ref 3.87–5.11)
RDW: 13.2 % (ref 11.5–15.5)
WBC: 8.6 10*3/uL (ref 4.0–10.5)
nRBC: 0 % (ref 0.0–0.2)

## 2018-01-29 LAB — POCT PREGNANCY, URINE: Preg Test, Ur: NEGATIVE

## 2018-01-29 LAB — TYPE AND SCREEN
ABO/RH(D): A POS
Antibody Screen: NEGATIVE

## 2018-01-29 LAB — HCG, QUANTITATIVE, PREGNANCY: hCG, Beta Chain, Quant, S: <1 m[IU]/mL (ref <5)

## 2018-01-29 LAB — ABO/RH: ABO/RH(D): A POS

## 2018-01-29 SURGERY — HYSTERECTOMY, VAGINAL, LAPAROSCOPY-ASSISTED, WITH SALPINGECTOMY
Anesthesia: General | Laterality: Bilateral

## 2018-01-29 MED ORDER — SODIUM CHLORIDE (PF) 0.9 % IJ SOLN
INTRAMUSCULAR | Status: AC
  Filled 2018-01-29: qty 50

## 2018-01-29 MED ORDER — HYDROMORPHONE HCL 1 MG/ML IJ SOLN
INTRAMUSCULAR | Status: AC
  Administered 2018-01-29: 0.2 mg via INTRAVENOUS
  Filled 2018-01-29: qty 1

## 2018-01-29 MED ORDER — PROPOFOL 10 MG/ML IV BOLUS
INTRAVENOUS | Status: DC | PRN
  Administered 2018-01-29: 160 mg via INTRAVENOUS

## 2018-01-29 MED ORDER — ESMOLOL HCL 100 MG/10ML IV SOLN
INTRAVENOUS | Status: DC | PRN
  Administered 2018-01-29 (×2): 10 mg via INTRAVENOUS

## 2018-01-29 MED ORDER — DEXAMETHASONE SODIUM PHOSPHATE 10 MG/ML IJ SOLN
INTRAMUSCULAR | Status: DC | PRN
  Administered 2018-01-29: 10 mg via INTRAVENOUS

## 2018-01-29 MED ORDER — FENTANYL CITRATE (PF) 100 MCG/2ML IJ SOLN
INTRAMUSCULAR | Status: AC
  Filled 2018-01-29: qty 2

## 2018-01-29 MED ORDER — OXYCODONE HCL 5 MG PO TABS
5.0000 mg | ORAL_TABLET | ORAL | Status: DC | PRN
  Administered 2018-01-29 – 2018-01-30 (×4): 10 mg via ORAL
  Filled 2018-01-29 (×4): qty 2

## 2018-01-29 MED ORDER — MIDAZOLAM HCL 2 MG/2ML IJ SOLN
INTRAMUSCULAR | Status: DC | PRN
  Administered 2018-01-29: 2 mg via INTRAVENOUS

## 2018-01-29 MED ORDER — MIDAZOLAM HCL 2 MG/2ML IJ SOLN
INTRAMUSCULAR | Status: AC
  Filled 2018-01-29: qty 2

## 2018-01-29 MED ORDER — SIMETHICONE 80 MG PO CHEW: 80.0000 mg | CHEWABLE_TABLET | Freq: Four times a day (QID) | ORAL | Status: DC | PRN

## 2018-01-29 MED ORDER — MEPERIDINE HCL 50 MG/ML IJ SOLN
100.0000 mg | Freq: Once | INTRAMUSCULAR | Status: AC
  Administered 2018-01-29: 100 mg via INTRAMUSCULAR
  Filled 2018-01-29: qty 2

## 2018-01-29 MED ORDER — METOPROLOL TARTRATE 5 MG/5ML IV SOLN
INTRAVENOUS | Status: DC | PRN
  Administered 2018-01-29 (×3): 2 mg via INTRAVENOUS

## 2018-01-29 MED ORDER — VASOPRESSIN 20 UNIT/ML IV SOLN
INTRAVENOUS | Status: DC | PRN
  Administered 2018-01-29: 7 mL via INTRAMUSCULAR

## 2018-01-29 MED ORDER — BUPIVACAINE HCL 0.5 % IJ SOLN
INTRAMUSCULAR | Status: DC | PRN
  Administered 2018-01-29: 9 mL

## 2018-01-29 MED ORDER — ONDANSETRON HCL 4 MG/2ML IJ SOLN: 4.0000 mg | Freq: Once | INTRAMUSCULAR | Status: DC | PRN

## 2018-01-29 MED ORDER — BUPIVACAINE HCL (PF) 0.5 % IJ SOLN
INTRAMUSCULAR | Status: AC
  Filled 2018-01-29: qty 30

## 2018-01-29 MED ORDER — LACTATED RINGERS IV SOLN
INTRAVENOUS | Status: DC
  Administered 2018-01-29 (×3): via INTRAVENOUS

## 2018-01-29 MED ORDER — FENTANYL CITRATE (PF) 100 MCG/2ML IJ SOLN
INTRAMUSCULAR | Status: AC
  Administered 2018-01-29: 25 ug via INTRAVENOUS
  Filled 2018-01-29: qty 2

## 2018-01-29 MED ORDER — KETOROLAC TROMETHAMINE 30 MG/ML IJ SOLN
30.0000 mg | Freq: Once | INTRAMUSCULAR | Status: AC
  Administered 2018-01-29: 30 mg via INTRAVENOUS

## 2018-01-29 MED ORDER — SUGAMMADEX SODIUM 200 MG/2ML IV SOLN
INTRAVENOUS | Status: DC | PRN
  Administered 2018-01-29: 200 mg via INTRAVENOUS

## 2018-01-29 MED ORDER — CEFAZOLIN SODIUM-DEXTROSE 2-4 GM/100ML-% IV SOLN
INTRAVENOUS | Status: AC
  Filled 2018-01-29: qty 100

## 2018-01-29 MED ORDER — VASOPRESSIN 20 UNIT/ML IV SOLN
INTRAVENOUS | Status: AC
  Filled 2018-01-29: qty 1

## 2018-01-29 MED ORDER — FENTANYL CITRATE (PF) 100 MCG/2ML IJ SOLN
25.0000 ug | INTRAMUSCULAR | Status: AC | PRN
  Administered 2018-01-29 (×6): 25 ug via INTRAVENOUS

## 2018-01-29 MED ORDER — ROCURONIUM BROMIDE 100 MG/10ML IV SOLN
INTRAVENOUS | Status: DC | PRN
  Administered 2018-01-29: 50 mg via INTRAVENOUS
  Administered 2018-01-29: 20 mg via INTRAVENOUS

## 2018-01-29 MED ORDER — PROMETHAZINE HCL 25 MG/ML IJ SOLN
25.0000 mg | Freq: Once | INTRAMUSCULAR | Status: AC
  Administered 2018-01-29: 25 mg via INTRAMUSCULAR
  Filled 2018-01-29: qty 1

## 2018-01-29 MED ORDER — DEXTROSE IN LACTATED RINGERS 5 % IV SOLN
INTRAVENOUS | Status: DC
  Administered 2018-01-29: 16:00:00 via INTRAVENOUS

## 2018-01-29 MED ORDER — HYDROMORPHONE HCL 1 MG/ML IJ SOLN
0.2000 mg | Freq: Once | INTRAMUSCULAR | Status: AC
  Administered 2018-01-29: 0.2 mg via INTRAVENOUS

## 2018-01-29 MED ORDER — KETOROLAC TROMETHAMINE 30 MG/ML IJ SOLN
30.0000 mg | Freq: Four times a day (QID) | INTRAMUSCULAR | Status: AC
  Administered 2018-01-29 – 2018-01-30 (×4): 30 mg via INTRAVENOUS
  Filled 2018-01-29 (×4): qty 1

## 2018-01-29 MED ORDER — FENTANYL CITRATE (PF) 100 MCG/2ML IJ SOLN
INTRAMUSCULAR | Status: DC | PRN
  Administered 2018-01-29: 50 ug via INTRAVENOUS
  Administered 2018-01-29: 100 ug via INTRAVENOUS
  Administered 2018-01-29: 50 ug via INTRAVENOUS

## 2018-01-29 MED ORDER — PROPOFOL 10 MG/ML IV BOLUS
INTRAVENOUS | Status: AC
  Filled 2018-01-29: qty 20

## 2018-01-29 MED ORDER — KETOROLAC TROMETHAMINE 30 MG/ML IJ SOLN
INTRAMUSCULAR | Status: AC
  Filled 2018-01-29: qty 1

## 2018-01-29 MED ORDER — ONDANSETRON HCL 4 MG/2ML IJ SOLN
INTRAMUSCULAR | Status: DC | PRN
  Administered 2018-01-29: 4 mg via INTRAVENOUS

## 2018-01-29 MED ORDER — IBUPROFEN 600 MG PO TABS: 600.0000 mg | ORAL_TABLET | Freq: Four times a day (QID) | ORAL | Status: DC

## 2018-01-29 MED ORDER — CEFAZOLIN SODIUM-DEXTROSE 2-4 GM/100ML-% IV SOLN
2.0000 g | INTRAVENOUS | Status: AC
  Administered 2018-01-29: 2 g via INTRAVENOUS

## 2018-01-29 SURGICAL SUPPLY — 45 items
ADHESIVE MASTISOL STRL (MISCELLANEOUS) ×2 IMPLANT
BAG URINE DRAINAGE (UROLOGICAL SUPPLIES) ×2 IMPLANT
BLADE SURG SZ11 CARB STEEL (BLADE) ×2 IMPLANT
CATH FOLEY 2WAY  5CC 16FR (CATHETERS) ×1
CATH URTH 16FR FL 2W BLN LF (CATHETERS) ×1 IMPLANT
CHLORAPREP W/TINT 26ML (MISCELLANEOUS) ×2 IMPLANT
COVER WAND RF STERILE (DRAPES) ×2 IMPLANT
DEFOGGER SCOPE WARMER CLEARIFY (MISCELLANEOUS) ×2 IMPLANT
DERMABOND ADVANCED (GAUZE/BANDAGES/DRESSINGS) ×1
DERMABOND ADVANCED .7 DNX12 (GAUZE/BANDAGES/DRESSINGS) ×1 IMPLANT
DRSG TEGADERM 2-3/8X2-3/4 SM (GAUZE/BANDAGES/DRESSINGS) ×4 IMPLANT
ELECT REM PT RETURN 9FT ADLT (ELECTROSURGICAL) ×2
ELECTRODE REM PT RTRN 9FT ADLT (ELECTROSURGICAL) ×1 IMPLANT
GLOVE BIOGEL PI ORTHO PRO 7.5 (GLOVE) ×1
GLOVE PI ORTHO PRO STRL 7.5 (GLOVE) ×1 IMPLANT
GLOVE SURG SYN 8.0 (GLOVE) ×4 IMPLANT
GOWN STRL REUS W/ TWL LRG LVL3 (GOWN DISPOSABLE) ×1 IMPLANT
GOWN STRL REUS W/ TWL XL LVL3 (GOWN DISPOSABLE) ×2 IMPLANT
GOWN STRL REUS W/TWL LRG LVL3 (GOWN DISPOSABLE) ×1
GOWN STRL REUS W/TWL XL LVL3 (GOWN DISPOSABLE) ×2
HANDLE YANKAUER SUCT BULB TIP (MISCELLANEOUS) ×4 IMPLANT
IRRIGATION STRYKERFLOW (MISCELLANEOUS) IMPLANT
IRRIGATOR STRYKERFLOW (MISCELLANEOUS)
IV LACTATED RINGERS 1000ML (IV SOLUTION) ×2 IMPLANT
KIT PINK PAD W/HEAD ARE REST (MISCELLANEOUS) ×2
KIT PINK PAD W/HEAD ARM REST (MISCELLANEOUS) ×1 IMPLANT
KIT TURNOVER CYSTO (KITS) ×2 IMPLANT
LIGASURE LAP MARYLAND 5MM 37CM (ELECTROSURGICAL) ×4 IMPLANT
PACK BASIN MINOR ARMC (MISCELLANEOUS) ×2 IMPLANT
PACK GYN LAPAROSCOPIC (MISCELLANEOUS) ×2 IMPLANT
PAD OB MATERNITY 4.3X12.25 (PERSONAL CARE ITEMS) ×2 IMPLANT
PAD PREP 24X41 OB/GYN DISP (PERSONAL CARE ITEMS) ×2 IMPLANT
SLEEVE ENDOPATH XCEL 5M (ENDOMECHANICALS) ×4 IMPLANT
SOLUTION ELECTROLUBE (MISCELLANEOUS) ×2 IMPLANT
SPONGE GAUZE 2X2 8PLY STRL LF (GAUZE/BANDAGES/DRESSINGS) ×6 IMPLANT
SPONGE LAP 18X18 RF (DISPOSABLE) ×2 IMPLANT
SUT VIC AB 0 CT1 27 (SUTURE) ×2
SUT VIC AB 0 CT1 27XCR 8 STRN (SUTURE) ×2 IMPLANT
SUT VIC AB 0 CT1 36 (SUTURE) ×2 IMPLANT
SUT VIC AB 4-0 FS2 27 (SUTURE) ×2 IMPLANT
SYR 10ML LL (SYRINGE) ×2 IMPLANT
TAPE TRANSPORE STRL 2 31045 (GAUZE/BANDAGES/DRESSINGS) ×2 IMPLANT
TROCAR XCEL NON-BLD 5MMX100MML (ENDOMECHANICALS) ×2 IMPLANT
TUBING CONNECTING 10 (TUBING) ×2 IMPLANT
TUBING INSUF HEATED (TUBING) ×2 IMPLANT

## 2018-01-29 NOTE — OR Nursing (Signed)
Patient has had a dry non productive cough for about 4-5 days. She denies fever or feeling poorly. Breath sounds are clear bilaterally. Dr. Baruch Merl aware and has cleared her for surgery today.

## 2018-01-29 NOTE — Op Note (Signed)
OPERATIVE NOTE 01/29/2018 10:19 AM  PRE-OPERATIVE DIAGNOSIS:  1) DYSFUNCTIONAL UTERINE BLEEDING,FIBROIDS, DYSMENORRHEA  POST-OPERATIVE DIAGNOSIS:  * No Diagnosis Codes entered *  OPERATION: Procedure(s) (LRB): LAPAROSCOPIC ASSISTED VAGINAL HYSTERECTOMY WITH BILATERAL SALPINGECTOMY (Bilateral)  SURGEON(S): Surgeon(s) and Role:    Harlin Heys, MD - Primary   ANESTHESIA: General  ESTIMATED BLOOD LOSS:  150 ml  OPERATIVE FINDINGS: Allen-Master's windows, Anterior uterine adhesions, Left tubal cyst  SPECIMEN:  ID Type Source Tests Collected by Time Destination  1 : uterus with cervix, bilateral tubes Tissue [facility] benign resection SURGICAL PATHOLOGY Harlin Heys, MD 25/85/2778 [id number]     COMPLICATIONS: None  DRAINS: Foley to gravity  DISPOSITION: Stable to recovery room  DESCRIPTION OF PROCEDURE:      The patient was prepped and draped in the dorsolithotomy position and placed under general anesthesia. The bladder was emptied. The cervix was grasped with a multi-toothed tenaculum and a uterine manipulator was placed within the cervical os respecting the position and curvature of the uterus. After changing gloves we proceeded abdominally. A small infraumbilical incision was made and a 5 mm trocar port was placed within the abdominopelvic cavity. The opening pressure was less than 7 mmHg.  Approximately 3 and 1/2 L of carbon dioxide gas was instilled within the abdominal pelvic cavity. The laparoscope was placed and the pelvis and abdomen were carefully inspected. In the usual manner, under direct visualization right and left lower quadrant ports of 5 mm size were placed. Both ureters were identified in the pelvis prior to dissection or clamping and cutting of pedicles. The fallopian tubes were elevated and the mesenteric side systematically coagulated and divided allowing the tube to be removed at the time of uterine removal. The round ligaments were coagulated  and divided and a bladder flap was created. The upper aspect of the broad ligament was clamped coagulated and divided. The uterine arteries were skeletonized, triply coagulated and divided. Careful inspection of all pedicles and the remainder of the pelvis was performed. Hemostasis was noted. The lower quadrant ports were removed, hemostasis of the port sites was noted, and the incisions were closed in subcuticular manner. The laparoscope and trocar sleeve were removed from the infraumbilical incision, hemostasis was noted, and the incision was closed in a subcuticular manner. A long-acting anesthetic was employed in the skin incisions. We then proceeded vaginally. A weighted speculum was placed posteriorly. A multi-toothed tenaculum was used to grasp the cervix and the cervix was injected in a circumferential manner with a dilute Pitressin solution. An incision was made around the cervix and the vaginal mucosa was dissected off of the cervix. The posterior cul-de-sac was identified and entered and the weighted speculum was placed within this. The anterior cul-de-sac was identified and entered and a retractor was placed and used to retract the bladder anteriorly keeping it out of the operative field. The uterosacral ligaments were clamped divided and suture ligated. The cardinal ligaments were clamped divided and suture ligated. The small remaining pedicle was clamped divided and suture ligated bilaterally allowing delivery of the specimen. Angle sutures were placed in the manner. A culdoplasty was performed. The peritoneum was identified anteriorly and then incorporating the left upper pedicle left lower pedicle right lower pedicle right upper pedicle and anterior peritoneum a pursestring suture was placed exteriorizing all pedicles. Hemostasis of all pedicles was noted at this time. The vaginal mucosa was then closed with a running suture of Vicryl.  The patient went to recovery  room in stable condition.  Clear urine was noted in the Foley at the conclusion of the procedure.  Finis Bud, M.D. 01/29/2018 10:19 AM

## 2018-01-29 NOTE — Anesthesia Post-op Follow-up Note (Signed)
Anesthesia QCDR form completed.        

## 2018-01-29 NOTE — Anesthesia Preprocedure Evaluation (Signed)
Anesthesia Evaluation  Patient identified by MRN, date of birth, ID band Patient awake    Reviewed: Allergy & Precautions, H&P , NPO status , Patient's Chart, lab work & pertinent test results, reviewed documented beta blocker date and time   Airway Mallampati: II  TM Distance: >3 FB Neck ROM: full    Dental  (+) Teeth Intact   Pulmonary neg pulmonary ROS,    Pulmonary exam normal        Cardiovascular Exercise Tolerance: Good hypertension, negative cardio ROS Normal cardiovascular exam Rhythm:regular Rate:Normal     Neuro/Psych  Headaches, negative neurological ROS  negative psych ROS   GI/Hepatic negative GI ROS, Neg liver ROS,   Endo/Other  negative endocrine ROS  Renal/GU negative Renal ROS  negative genitourinary   Musculoskeletal   Abdominal   Peds  Hematology negative hematology ROS (+)   Anesthesia Other Findings History reviewed. No pertinent past medical history. Past Surgical History: 2015: BREAST BIOPSY; Right     Comment:  Benign breast tissue with cystic and papillary apically               metaplasia No date: CESAREAN SECTION 2010: TUBAL LIGATION BMI    Body Mass Index:  27.39 kg/m     Reproductive/Obstetrics negative OB ROS                             Anesthesia Physical Anesthesia Plan  ASA: II  Anesthesia Plan: General ETT   Post-op Pain Management:    Induction:   PONV Risk Score and Plan:   Airway Management Planned:   Additional Equipment:   Intra-op Plan:   Post-operative Plan:   Informed Consent: I have reviewed the patients History and Physical, chart, labs and discussed the procedure including the risks, benefits and alternatives for the proposed anesthesia with the patient or authorized representative who has indicated his/her understanding and acceptance.   Dental Advisory Given  Plan Discussed with: CRNA  Anesthesia Plan Comments:          Anesthesia Quick Evaluation

## 2018-01-29 NOTE — Transfer of Care (Signed)
Immediate Anesthesia Transfer of Care Note  Patient: Shirley Harris  Procedure(s) Performed: LAPAROSCOPIC ASSISTED VAGINAL HYSTERECTOMY WITH BILATERAL SALPINGECTOMY (Bilateral )  Patient Location: PACU  Anesthesia Type:General  Level of Consciousness: awake, alert  and oriented  Airway & Oxygen Therapy: Patient Spontanous Breathing and Patient connected to face mask oxygen  Post-op Assessment: Report given to RN  Post vital signs: Reviewed and stable  Last Vitals:  Vitals Value Taken Time  BP    Temp    Pulse 94 01/29/2018 10:22 AM  Resp 14 01/29/2018 10:22 AM  SpO2 100 % 01/29/2018 10:22 AM  Vitals shown include unvalidated device data.  Last Pain:  Vitals:   01/29/18 0613  TempSrc: Temporal  PainSc: 0-No pain         Complications: No apparent anesthesia complications

## 2018-01-29 NOTE — Anesthesia Procedure Notes (Signed)
Procedure Name: Intubation Date/Time: 01/29/2018 8:00 AM Performed by: Lesle Reek, CRNA Pre-anesthesia Checklist: Timeout performed, Patient being monitored, Suction available, Emergency Drugs available and Patient identified Patient Re-evaluated:Patient Re-evaluated prior to induction Oxygen Delivery Method: Circle system utilized Preoxygenation: Pre-oxygenation with 100% oxygen Induction Type: IV induction and Cricoid Pressure applied Laryngoscope Size: Mac and 3 Grade View: Grade II Tube type: Oral Tube size: 7.0 mm Number of attempts: 1 Airway Equipment and Method: Stylet Placement Confirmation: ETT inserted through vocal cords under direct vision Secured at: 21 cm Tube secured with: Tape

## 2018-01-30 DIAGNOSIS — N72 Inflammatory disease of cervix uteri: Secondary | ICD-10-CM | POA: Diagnosis not present

## 2018-01-30 DIAGNOSIS — N946 Dysmenorrhea, unspecified: Secondary | ICD-10-CM | POA: Diagnosis not present

## 2018-01-30 DIAGNOSIS — N838 Other noninflammatory disorders of ovary, fallopian tube and broad ligament: Secondary | ICD-10-CM | POA: Diagnosis not present

## 2018-01-30 DIAGNOSIS — N809 Endometriosis, unspecified: Secondary | ICD-10-CM | POA: Diagnosis not present

## 2018-01-30 DIAGNOSIS — Z79899 Other long term (current) drug therapy: Secondary | ICD-10-CM | POA: Diagnosis not present

## 2018-01-30 DIAGNOSIS — N719 Inflammatory disease of uterus, unspecified: Secondary | ICD-10-CM | POA: Diagnosis not present

## 2018-01-30 DIAGNOSIS — N938 Other specified abnormal uterine and vaginal bleeding: Secondary | ICD-10-CM | POA: Diagnosis not present

## 2018-01-30 DIAGNOSIS — G43909 Migraine, unspecified, not intractable, without status migrainosus: Secondary | ICD-10-CM | POA: Diagnosis not present

## 2018-01-30 DIAGNOSIS — N736 Female pelvic peritoneal adhesions (postinfective): Secondary | ICD-10-CM | POA: Diagnosis not present

## 2018-01-30 DIAGNOSIS — I10 Essential (primary) hypertension: Secondary | ICD-10-CM | POA: Diagnosis not present

## 2018-01-30 DIAGNOSIS — Z886 Allergy status to analgesic agent status: Secondary | ICD-10-CM | POA: Diagnosis not present

## 2018-01-30 DIAGNOSIS — Z91013 Allergy to seafood: Secondary | ICD-10-CM | POA: Diagnosis not present

## 2018-01-30 DIAGNOSIS — N711 Chronic inflammatory disease of uterus: Secondary | ICD-10-CM | POA: Diagnosis not present

## 2018-01-30 DIAGNOSIS — D250 Submucous leiomyoma of uterus: Secondary | ICD-10-CM | POA: Diagnosis not present

## 2018-01-30 LAB — SURGICAL PATHOLOGY

## 2018-01-30 MED ORDER — OXYCODONE HCL 5 MG PO TABS: 5.0000 mg | ORAL_TABLET | ORAL | 0 refills | Status: DC | PRN

## 2018-01-30 NOTE — Progress Notes (Signed)
Discharge instructions complete. Patient verbalizes understanding of teaching. Patient discharged home via wheelchair at 1500.

## 2018-01-30 NOTE — Progress Notes (Signed)
Patient states that [facility] will not fill their prescription for ox[facility]codone. [facility] called unit and stated that they needed an "ICD-10 diagnosis code in order to fill prescription." RN called MD, Dr. Amalia Hailey. MD states diagnosis is acute postop pain. ICD 10 diagnosis for "acute postop pain " is G89.18. Code given to [facility] pharmacist, Thurmond Butts.   Patient and family also requesting prescription for ibuprofen. MD notified and states patient can get ibuprofen over the counter and take 3. RN updated patient/family.

## 2018-01-30 NOTE — Discharge Summary (Signed)
    Discharge Summary  Admit date: 01/29/2018  Discharge Date and Time:01/30/2018  8:10 AM  Discharge to:  Home  Admission Diagnosis: Present on Admission: . Fibroids, submucosal                     Discharge  Diagnoses: Active Problems:   Fibroids, submucosal Endometriosis Pelvic adhesions  OR Procedures:   Procedure(s): LAPAROSCOPIC ASSISTED VAGINAL HYSTERECTOMY WITH BILATERAL SALPINGECTOMY Date -------------------                              Discharge Day Progress Note:   Subjective:   The patient does not have complaints.  She is ambulating well. She is taking PO well. Her pain is well controlled with her current medications. She is urinating without difficulty and is passing flatus.   Objective:  BP 125/81   Pulse 97   Temp 97.7 F (36.5 C) (Oral)   Resp 20   Ht 5' 5.5" (1.664 m)   Wt 75.8 kg   SpO2 100%   BMI 27.39 kg/m     Abdomen:                         clean, dry, no drainage    Assessment:   Doing well.  Normal progress as expected.   Pain well controlled today  Plan:        Discharge home.                       Medications as directed.  Hospital Course:  No notes on file   Condition at Discharge:  good Discharge Medications:  Allergies as of 01/30/2018      Reactions   Acetaminophen Itching, Swelling, Other (See Comments)   Also notes BURNING   Shellfish Allergy Itching, Other (See Comments)   Itching of tongue and throat      Medication List    STOP taking these medications   clobetasol cream 0.05 % Commonly known as:  TEMOVATE     TAKE these medications   lisinopril 10 MG tablet Commonly known as:  PRINIVIL,ZESTRIL Take 1 tablet (10 mg total) by mouth daily. What changed:  when to take this   nortriptyline 10 MG capsule Commonly known as:  PAMELOR Take 30 mg by mouth at bedtime.   oxyCODONE 5 MG immediate release tablet Commonly known as:  Oxy IR/ROXICODONE Take 1-2 tablets (5-10 mg total) by mouth every 4 (four) hours  as needed for severe pain.   rizatriptan 10 MG tablet Commonly known as:  MAXALT Take 10 mg by mouth as needed for migraine. May repeat in 2 hours if needed   topiramate 25 MG tablet Commonly known as:  TOPAMAX Take 25 mg by mouth See admin instructions. Take 25 mg by mouth daily on cycle days 1-5        Follow Up:   Follow-up Information    Harlin Heys, MD Follow up in 1 week(s).   Specialty:  Obstetrics and Gynecology Contact information: [address] [place] [address] [phone]           Finis Bud, M.D. 01/30/2018 8:10 AM

## 2018-01-30 NOTE — Progress Notes (Signed)
Vitals stable and WDL. Patient tolerating PO food/fluid. Patient showered. No dizziness or lightheadedness reported. Patient ambulated in hallway. Tolerated well. Patient pain around 4/10. Pain taking PO oxycodone, and IV Toradol but patient is ok with switching to PO Ibuprofen. Patient ready for discharge.

## 2018-02-06 ENCOUNTER — Encounter: Payer: Self-pay | Admitting: Obstetrics and Gynecology

## 2018-02-06 ENCOUNTER — Ambulatory Visit (INDEPENDENT_AMBULATORY_CARE_PROVIDER_SITE_OTHER): Payer: BLUE CROSS/BLUE SHIELD | Admitting: Obstetrics and Gynecology

## 2018-02-06 VITALS — BP 111/74 | HR 91 | Ht 66.0 in | Wt 165.9 lb

## 2018-02-06 DIAGNOSIS — Z9889 Other specified postprocedural states: Secondary | ICD-10-CM

## 2018-02-06 DIAGNOSIS — M79605 Pain in left leg: Secondary | ICD-10-CM

## 2018-02-06 MED ORDER — GABAPENTIN 600 MG PO TABS: 600.0000 mg | ORAL_TABLET | Freq: Three times a day (TID) | ORAL | 0 refills | Status: DC

## 2018-02-06 NOTE — Progress Notes (Signed)
Patient comes in today for Post op appointment. Patient states that she is having pain in her left thigh where the medication was given. Patient states that when she walks she feels a "shock go down her leg".

## 2018-02-06 NOTE — Progress Notes (Signed)
HPI:      [name] is a 47 y.o. [phone] who LMP was Patient's last menstrual period was 12/05/2017 (exact date).  Subjective:   She presents today 1 week postop.  She reports that she was doing well with the exception of intermittent numbness and pain in her left leg.  She reports the numbness is in her lateral thigh and occasionally shoots down to her foot.  She reports it has not been disabling and she reports no foot drop but she says it is somewhat disconcerting. She is ambulating voiding eating and having bowel movements without issue. Her abdominal pain is controlled with Motrin.    Hx: The following portions of the patient's history were reviewed and updated as appropriate:             She  has no past medical history on file. She does not have any pertinent problems on file. She  has a past surgical history that includes Cesarean section; Tubal ligation (2010); Breast biopsy (Right, 2015); and Laparoscopic vaginal hysterectomy with salpingectomy (Bilateral, 01/29/2018). Her family history includes Multiple sclerosis in her father; Stroke in her mother. She  reports that she has never smoked. She has never used smokeless tobacco. She reports previous alcohol use. She reports that she does not use drugs. She has a current medication list which includes the following prescription(s): lisinopril, nortriptyline, oxycodone, rizatriptan, topiramate, and gabapentin. She is allergic to acetaminophen and shellfish allergy.       Review of Systems:  Review of Systems  Constitutional: Denied constitutional symptoms, night sweats, recent illness, fatigue, fever, insomnia and weight loss.  Eyes: Denied eye symptoms, eye pain, photophobia, vision change and visual disturbance.  Ears/Nose/Throat/Neck: Denied ear, nose, throat or neck symptoms, hearing loss, nasal discharge, sinus congestion and sore throat.  Cardiovascular: Denied cardiovascular symptoms, arrhythmia, chest pain/pressure,  edema, exercise intolerance, orthopnea and palpitations.  Respiratory: Denied pulmonary symptoms, asthma, pleuritic pain, productive sputum, cough, dyspnea and wheezing.  Gastrointestinal: Denied, gastro-esophageal reflux, melena, nausea and vomiting.  Genitourinary: Denied genitourinary symptoms including symptomatic vaginal discharge, pelvic relaxation issues, and urinary complaints.  Musculoskeletal: See HPI for additional information.  Dermatologic: Denied dermatology symptoms, rash and scar.  Neurologic: Denied neurology symptoms, dizziness, headache, neck pain and syncope.  Psychiatric: Denied psychiatric symptoms, anxiety and depression.  Endocrine: Denied endocrine symptoms including hot flashes and night sweats.   Meds:   Current Outpatient Medications on File Prior to Visit  Medication Sig Dispense Refill  . lisinopril (PRINIVIL,ZESTRIL) 10 MG tablet Take 1 tablet (10 mg total) by mouth daily. (Patient taking differently: Take 10 mg by mouth at bedtime. ) 90 tablet 1  . nortriptyline (PAMELOR) 10 MG capsule Take 30 mg by mouth at bedtime.     Marland Kitchen oxyCODONE (OXY IR/ROXICODONE) 5 MG immediate release tablet Take 1-2 tablets (5-10 mg total) by mouth every 4 (four) hours as needed for severe pain. 20 tablet 0  . rizatriptan (MAXALT) 10 MG tablet Take 10 mg by mouth as needed for migraine. May repeat in 2 hours if needed    . topiramate (TOPAMAX) 25 MG tablet Take 25 mg by mouth See admin instructions. Take 25 mg by mouth daily on cycle days 1-5     No current facility-administered medications on file prior to visit.     Objective:     Vitals:   02/06/18 1011  BP: 111/74  Pulse: 91               Abdomen:  Soft.  Non-tender.  No masses.  No HSM.  Incision/s: Intact.  Healing well.  No erythema.  No drainage.      Assessment:    [id number] Patient Active Problem List   Diagnosis Date Noted  . Fibroids, submucosal 01/29/2018  . Hypertension 12/04/2017  . Overweight (BMI  25.0-29.9) 12/04/2017  . Migraine 12/04/2017  . Insomnia 12/04/2017  . Chronic tension-type headache, not intractable 12/02/2016  . Abnormal mammogram 05/14/2013     1. Post-operative state   2. Leg pain, lateral, left     Leg pain possibly secondary to stirrup positioning during surgery.  Patient doing well otherwise.   Plan:            1.  Discussed options of expectant management versus Neurontin for nerve pain.  Patient has elected to try Neurontin as prescribed.   Orders No orders of the defined types were placed in this encounter.    Meds ordered this encounter  Medications  . gabapentin (NEURONTIN) 600 MG tablet    Sig: Take 1 tablet (600 mg total) by mouth 3 (three) times daily. Take as directed building up to 3 times per day    Dispense:  100 tablet    Refill:  0      F/U  No follow-ups on file.  Finis Bud, M.D. 02/06/2018 11:29 AM

## 2018-02-09 NOTE — Anesthesia Postprocedure Evaluation (Signed)
Anesthesia Post Note  Patient: Shirley Harris  Procedure(s) Performed: LAPAROSCOPIC ASSISTED VAGINAL HYSTERECTOMY WITH BILATERAL SALPINGECTOMY (Bilateral )  Patient location during evaluation: PACU Anesthesia Type: General Level of consciousness: awake and alert Pain management: pain level controlled Vital Signs Assessment: post-procedure vital signs reviewed and stable Respiratory status: spontaneous breathing, nonlabored ventilation, respiratory function stable and patient connected to nasal cannula oxygen Cardiovascular status: blood pressure returned to baseline and stable Postop Assessment: no apparent nausea or vomiting Anesthetic complications: no     Last Vitals:  Vitals:   01/30/18 0323 01/30/18 0820  BP: 125/81 132/85  Pulse: 97 84  Resp: 20 16  Temp: 36.5 C 36.7 C  SpO2: 100% 100%    Last Pain:  Vitals:   01/30/18 1228  TempSrc:   PainSc: [place]

## 2018-02-28 ENCOUNTER — Other Ambulatory Visit: Payer: Self-pay | Admitting: Obstetrics and Gynecology

## 2018-02-28 DIAGNOSIS — M79605 Pain in left leg: Secondary | ICD-10-CM

## 2018-03-06 ENCOUNTER — Encounter: Payer: Self-pay | Admitting: Surgical

## 2018-03-06 ENCOUNTER — Encounter: Payer: Self-pay | Admitting: Obstetrics and Gynecology

## 2018-03-06 ENCOUNTER — Ambulatory Visit (INDEPENDENT_AMBULATORY_CARE_PROVIDER_SITE_OTHER): Payer: Managed Care, Other (non HMO) | Admitting: Obstetrics and Gynecology

## 2018-03-06 VITALS — BP 129/86 | HR 87 | Ht 66.0 in | Wt 168.1 lb

## 2018-03-06 DIAGNOSIS — Z9889 Other specified postprocedural states: Secondary | ICD-10-CM

## 2018-03-06 NOTE — Progress Notes (Signed)
HPI:      [name] is a 48 y.o. [id number] who LMP was Patient's last menstrual period was 12/05/2017 (exact date).  Subjective:   She presents today 5 weeks postop.  She reports that she is doing well.  Ambulating voiding eating without difficulty, bowel movements without difficulty.  She still has some numbness and pain in her left leg but it is improving.  She does not find it disabling and has no trouble functioning. She reports no vaginal bleeding. Desires a return to work note for 1 week.    Hx: The following portions of the patient's history were reviewed and updated as appropriate:             She  has no past medical history on file. She does not have any pertinent problems on file. She  has a past surgical history that includes Cesarean section; Tubal ligation (2010); Breast biopsy (Right, 2015); and Laparoscopic vaginal hysterectomy with salpingectomy (Bilateral, 01/29/2018). Her family history includes Multiple sclerosis in her father; Stroke in her mother. She  reports that she has never smoked. She has never used smokeless tobacco. She reports previous alcohol use. She reports that she does not use drugs. She has a current medication list which includes the following prescription(s): gabapentin, lisinopril, nortriptyline, rizatriptan, topiramate, and oxycodone. She is allergic to acetaminophen and shellfish allergy.       Review of Systems:  Review of Systems  Constitutional: Denied constitutional symptoms, night sweats, recent illness, fatigue, fever, insomnia and weight loss.  Eyes: Denied eye symptoms, eye pain, photophobia, vision change and visual disturbance.  Ears/Nose/Throat/Neck: Denied ear, nose, throat or neck symptoms, hearing loss, nasal discharge, sinus congestion and sore throat.  Cardiovascular: Denied cardiovascular symptoms, arrhythmia, chest pain/pressure, edema, exercise intolerance, orthopnea and palpitations.  Respiratory: Denied pulmonary symptoms,  asthma, pleuritic pain, productive sputum, cough, dyspnea and wheezing.  Gastrointestinal: Denied, gastro-esophageal reflux, melena, nausea and vomiting.  Genitourinary: Denied genitourinary symptoms including symptomatic vaginal discharge, pelvic relaxation issues, and urinary complaints.  Musculoskeletal: Denied musculoskeletal symptoms, stiffness, swelling, muscle weakness and myalgia.  Dermatologic: Denied dermatology symptoms, rash and scar.  Neurologic: Denied neurology symptoms, dizziness, headache, neck pain and syncope.  Psychiatric: Denied psychiatric symptoms, anxiety and depression.  Endocrine: Denied endocrine symptoms including hot flashes and night sweats.   Meds:   Current Outpatient Medications on File Prior to Visit  Medication Sig Dispense Refill  . gabapentin (NEURONTIN) 600 MG tablet Take 1 tablet (600 mg total) by mouth 3 (three) times daily. Take as directed building up to 3 times per day 100 tablet 0  . lisinopril (PRINIVIL,ZESTRIL) 10 MG tablet Take 1 tablet (10 mg total) by mouth daily. (Patient taking differently: Take 10 mg by mouth at bedtime. ) 90 tablet 1  . nortriptyline (PAMELOR) 10 MG capsule Take 30 mg by mouth at bedtime.     . rizatriptan (MAXALT) 10 MG tablet Take 10 mg by mouth as needed for migraine. May repeat in 2 hours if needed    . topiramate (TOPAMAX) 25 MG tablet Take 25 mg by mouth See admin instructions. Take 25 mg by mouth daily on cycle days 1-5    . oxyCODONE (OXY IR/ROXICODONE) 5 MG immediate release tablet Take 1-2 tablets (5-10 mg total) by mouth every 4 (four) hours as needed for severe pain. (Patient not taking: Reported on 03/06/2018) 20 tablet 0   No current facility-administered medications on file prior to visit.     Objective:  Vitals:   03/06/18 0931  BP: 129/86  Pulse: 87               Abdomen: Soft.  Non-tender.  No masses.  No HSM.  Incision/s: Intact.  Healing well.  No erythema.  No drainage.      Assessment:     [id number] Patient Active Problem List   Diagnosis Date Noted  . Fibroids, submucosal 01/29/2018  . Hypertension 12/04/2017  . Overweight (BMI 25.0-29.9) 12/04/2017  . Migraine 12/04/2017  . Insomnia 12/04/2017  . Chronic tension-type headache, not intractable 12/02/2016  . Abnormal mammogram 05/14/2013     1. Post-operative state     Patient doing well with excellent recovery.  Leg numbness improving   Plan:            1.  Patient may return to work in 1 week.  2.  Return to sexual activity in 2 to 3 weeks Orders No orders of the defined types were placed in this encounter.   No orders of the defined types were placed in this encounter.     F/U  Return in about 3 months (around 06/05/2018).  Finis Bud, M.D. 03/06/2018 10:08 AM

## 2018-03-06 NOTE — Progress Notes (Signed)
Patient comes in today for a post op check. Bowels are ok. Bowel movement yesterday evening. Patient states that she still has nerve pain in the leg. She also states that she has pain that is on the inside near the incision site.

## 2018-05-08 ENCOUNTER — Other Ambulatory Visit: Payer: Self-pay | Admitting: Obstetrics and Gynecology

## 2018-05-08 DIAGNOSIS — M79605 Pain in left leg: Secondary | ICD-10-CM

## 2018-06-05 ENCOUNTER — Other Ambulatory Visit: Payer: Self-pay

## 2018-06-05 ENCOUNTER — Encounter: Payer: Self-pay | Admitting: Obstetrics and Gynecology

## 2018-06-05 ENCOUNTER — Ambulatory Visit (INDEPENDENT_AMBULATORY_CARE_PROVIDER_SITE_OTHER): Payer: Managed Care, Other (non HMO) | Admitting: Obstetrics and Gynecology

## 2018-06-05 VITALS — Ht 66.0 in | Wt 168.0 lb

## 2018-06-05 DIAGNOSIS — M79605 Pain in left leg: Secondary | ICD-10-CM

## 2018-06-05 DIAGNOSIS — Z9889 Other specified postprocedural states: Secondary | ICD-10-CM | POA: Diagnosis not present

## 2018-06-05 NOTE — Progress Notes (Signed)
Virtual Visit via Telephone Note  I connected with Shirley Harris on 06/05/18 at  2:30 PM EDT by telephone and verified that I am speaking with the correct person using two identifiers.    Shirley Harris is a 48 y.o. [phone] who LMP was Patient's last menstrual period was 12/05/2017 (exact date). I discussed the limitations, risks, security and privacy concerns of performing an evaluation and management service by telephone and the availability of in person appointments. I also discussed with the patient that there may be a patient responsible charge related to this service. The patient expressed understanding and agreed to proceed.  Location of patient: Home  Patient gave explicit verbal consent for telephone visit:  YES  Location of provider:  [facility] office  Persons other than physician and patient involved in provider conference:  None   Subjective:   History of Present Illness:    Patient underwent hysterectomy in December.  She states that she is completely recovered from her surgery and has resumed normal activities.  She has no complaints.  She sounds like she is doing very well.  She does state that she still occasionally has some left leg pain but it does not limit her activities.  She continues to take gabapentin for this.  Hx: The following portions of the patient's history were reviewed and updated as appropriate:             She  has no past medical history on file. She does not have any pertinent problems on file. She  has a past surgical history that includes Cesarean section; Tubal ligation (2010); Breast biopsy (Right, 2015); and Laparoscopic vaginal hysterectomy with salpingectomy (Bilateral, 01/29/2018). Her family history includes Multiple sclerosis in her father; Stroke in her mother. She  reports that she has never smoked. She has never used smokeless tobacco. She reports previous alcohol use. She reports that she does not use drugs. She has a current medication list which  includes the following prescription(s): gabapentin, lisinopril, nortriptyline, oxycodone, rizatriptan, and topiramate. She is allergic to acetaminophen and shellfish allergy.       Review of Systems:  Review of Systems  Constitutional: Denied constitutional symptoms, night sweats, recent illness, fatigue, fever, insomnia and weight loss.  Eyes: Denied eye symptoms, eye pain, photophobia, vision change and visual disturbance.  Ears/Nose/Throat/Neck: Denied ear, nose, throat or neck symptoms, hearing loss, nasal discharge, sinus congestion and sore throat.  Cardiovascular: Denied cardiovascular symptoms, arrhythmia, chest pain/pressure, edema, exercise intolerance, orthopnea and palpitations.  Respiratory: Denied pulmonary symptoms, asthma, pleuritic pain, productive sputum, cough, dyspnea and wheezing.  Gastrointestinal: Denied, gastro-esophageal reflux, melena, nausea and vomiting.  Genitourinary: Denied genitourinary symptoms including symptomatic vaginal discharge, pelvic relaxation issues, and urinary complaints.  Musculoskeletal: Denied musculoskeletal symptoms, stiffness, swelling, muscle weakness and myalgia.  Dermatologic: Denied dermatology symptoms, rash and scar.  Neurologic: Denied neurology symptoms, dizziness, headache, neck pain and syncope.  Psychiatric: Denied psychiatric symptoms, anxiety and depression.  Endocrine: Denied endocrine symptoms including hot flashes and night sweats.   Meds:   Current Outpatient Medications on File Prior to Visit  Medication Sig Dispense Refill  . gabapentin (NEURONTIN) 600 MG tablet TAKE 1 TABLET BY MOUTH 3 (THREE) TIMES DAILY. TAKE AS DIRECTED BUILDING UP TO 3 TIMES PER DAY 90 tablet 1  . lisinopril (PRINIVIL,ZESTRIL) 10 MG tablet Take 1 tablet (10 mg total) by mouth daily. (Patient taking differently: Take 10 mg by mouth at bedtime. ) 90 tablet 1  . nortriptyline (PAMELOR) 10 MG capsule Take  30 mg by mouth at bedtime.     Marland Kitchen oxyCODONE (OXY  IR/ROXICODONE) 5 MG immediate release tablet Take 1-2 tablets (5-10 mg total) by mouth every 4 (four) hours as needed for severe pain. (Patient not taking: Reported on 03/06/2018) 20 tablet 0  . rizatriptan (MAXALT) 10 MG tablet Take 10 mg by mouth as needed for migraine. May repeat in 2 hours if needed    . topiramate (TOPAMAX) 25 MG tablet Take 25 mg by mouth See admin instructions. Take 25 mg by mouth daily on cycle days 1-5     No current facility-administered medications on file prior to visit.     Assessment:    [id number] Patient Active Problem List   Diagnosis Date Noted  . Fibroids, submucosal 01/29/2018  . Hypertension 12/04/2017  . Overweight (BMI 25.0-29.9) 12/04/2017  . Migraine 12/04/2017  . Insomnia 12/04/2017  . Chronic tension-type headache, not intractable 12/02/2016  . Abnormal mammogram 05/14/2013     1. Leg pain, lateral, left   2. Post-operative state     Patient seems to be doing very well status post surgery 5 months ago.  She continues to have some mild leg discomfort on occasion but it is not limiting.  Plan:            1.  Continue current care.  We have discussed her surgery in some detail and expected postop follow-up.  Leg pain discussed and if this continues for several more months consider neurologic follow-up for further recommendations.  2.  She states her annual examination is due in June and at this point we will plan follow-up around that time to reevaluate leg pain as well as routine labs and mammography. Orders No orders of the defined types were placed in this encounter.   No orders of the defined types were placed in this encounter.     F/U  Return in about 2 months (around 08/05/2018) for Annual Physical.  I discussed the assessment and treatment plan with the patient.  The patient agreed with the plan and demonstrated an understanding of the instructions.  She was given an opportunity to ask questions and all of her questions were  answered.   The patient was advised to call back or seek an in-person evaluation if the symptoms worsen or if the condition fails to improve as anticipated.  I provided 14 minutes of non-face-to-face time during this encounter.   Finis Bud, M.D. 06/05/2018 3:42 PM    I discussed the assessment and treatment plan with the patient.  The patient agreed with the plan and demonstrated an understanding of the instructions.  She was given an opportunity to ask questions and all of her questions were answered.   The patient was advised to call back or seek an in-person evaluation if the symptoms worsen or if the condition fails to improve as anticipated.  I provided 14 minutes of non-face-to-face time during this encounter.

## 2018-06-18 ENCOUNTER — Ambulatory Visit (INDEPENDENT_AMBULATORY_CARE_PROVIDER_SITE_OTHER): Payer: Managed Care, Other (non HMO) | Admitting: Family Medicine

## 2018-06-18 ENCOUNTER — Encounter: Payer: Self-pay | Admitting: Family Medicine

## 2018-06-18 ENCOUNTER — Other Ambulatory Visit: Payer: Self-pay

## 2018-06-18 DIAGNOSIS — Z1159 Encounter for screening for other viral diseases: Secondary | ICD-10-CM | POA: Diagnosis not present

## 2018-06-18 DIAGNOSIS — G4700 Insomnia, unspecified: Secondary | ICD-10-CM

## 2018-06-18 DIAGNOSIS — E663 Overweight: Secondary | ICD-10-CM

## 2018-06-18 DIAGNOSIS — I10 Essential (primary) hypertension: Secondary | ICD-10-CM

## 2018-06-18 DIAGNOSIS — B3731 Acute candidiasis of vulva and vagina: Secondary | ICD-10-CM

## 2018-06-18 DIAGNOSIS — Z114 Encounter for screening for human immunodeficiency virus [HIV]: Secondary | ICD-10-CM

## 2018-06-18 DIAGNOSIS — G43909 Migraine, unspecified, not intractable, without status migrainosus: Secondary | ICD-10-CM

## 2018-06-18 DIAGNOSIS — B373 Candidiasis of vulva and vagina: Secondary | ICD-10-CM

## 2018-06-18 DIAGNOSIS — J3089 Other allergic rhinitis: Secondary | ICD-10-CM

## 2018-06-18 MED ORDER — FLUTICASONE PROPIONATE 50 MCG/ACT NA SUSP: 2.0000 | Freq: Every day | NASAL | 11 refills | Status: AC

## 2018-06-18 MED ORDER — FLUCONAZOLE 150 MG PO TABS: ORAL_TABLET | ORAL | 0 refills | Status: DC

## 2018-06-18 MED ORDER — LEVOCETIRIZINE DIHYDROCHLORIDE 5 MG PO TABS: 5.0000 mg | ORAL_TABLET | Freq: Every evening | ORAL | 1 refills | Status: AC

## 2018-06-18 MED ORDER — BENZONATATE 100 MG PO CAPS: 100.0000 mg | ORAL_CAPSULE | Freq: Three times a day (TID) | ORAL | 0 refills | Status: DC | PRN

## 2018-06-18 MED ORDER — LISINOPRIL 10 MG PO TABS: 10.0000 mg | ORAL_TABLET | Freq: Every day | ORAL | 3 refills | Status: DC

## 2018-06-18 NOTE — Progress Notes (Signed)
Name: Shirley Harris   MRN: [id number]    DOB: [birth year]   Date:06/18/2018       Progress Note  Subjective  Chief Complaint  Chief Complaint  Patient presents with  . Follow-up    I connected with  Shirley Harris  on 06/18/18 at  8:20 AM EDT by a video enabled telemedicine application and verified that I am speaking with the correct person using two identifiers.  I discussed the limitations of evaluation and management by telemedicine and the availability of in person appointments. The patient expressed understanding and agreed to proceed. Staff also discussed with the patient that there may be a patient responsible charge related to this service. Patient Location: Home Provider Location: Home Additional Individuals present: None  HPI  S/p hysterectomy: Had hysterectomy 01/29/2018 for fibroids.  No abdominal pain or cramping.  Headaches seem to be improving with this.  She has had some LEFT lateral leg pain s/p hysterectomy that Dr. Amalia Hailey is currently managing with gabapentin; may be considering referral to neurology in the future.  Poss Yeast Infection: Has some white thick disharge and some itching/discomfort of the vulva.   HTN: Not checking at home, however review of the last several BP's by other providers shows normal BP range. -does take medications as prescribed - current regimen includes Lisinopril 10mg .  - taking medications as instructed, no medication side effects noted, no TIAs, no chest pain on exertion, no dyspnea on exertion, no swelling of ankles - DASH diet discussed - pt does follow a low sodium diet; salt not added to cooking and salt shaker not on table.  Insomnia - has difficulty falling asleep.  She has a lot of stress going on - she is a Human resources officer for [employer].  Discussed sleep hygiene in detail - we will start with this first and consider medication if not effective.    Chronic Migraines - seeing Dr. Melrose Nakayama, sometimes has cluster headaches; she is taking maxalt  PRN and nortriptyline.  Her headaches have improved since her hysterectomy, but does still get headaches around the time of when her period would occur (has ovaries intact). She did have an episode 1 year ago that put her in the ER where she had a complex migraine - was lightheaded and had one-sided weakness (left side)  AR: Taking clairitn PRN for AR, has been several months since she has needed this. Usually worse in the summer or when the weather changes quickly.  Has cough and sinus congestion that lasts for a few weeks, then goes away.  Currently taking Clairitn PRN, but feels this is not controlling her symptoms very well.    Overweight: She is working out in her garage and walking in the neighborhood.  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  Has cut down drastically on sweet beverages - only has 1-2 sodas each week; still drinking fruit juices.  Drinking more water.  Last A1C was 5.5% with [facility].   The 10-year ASCVD risk score Mikey Bussing DC Jr., et al., 2013) is: 1.1%   Values used to calculate the score:     Age: 48 years     Sex: Female     Is Non-Hispanic African American: Yes     Diabetic: No     Tobacco smoker: No     Systolic Blood Pressure: 272 mmHg     Is BP treated: Yes     HDL Cholesterol: 74 mg/dL  Total Cholesterol: 212 mg/dL   Patient Active Problem List   Diagnosis Date Noted  . Fibroids, submucosal 01/29/2018  . Hypertension 12/04/2017  . Overweight (BMI 25.0-29.9) 12/04/2017  . Migraine 12/04/2017  . Insomnia 12/04/2017  . Chronic tension-type headache, not intractable 12/02/2016  . Abnormal mammogram 05/14/2013    Past Surgical History:  Procedure Laterality Date  . BREAST BIOPSY Right 2015   Benign breast tissue with cystic and papillary apically metaplasia  . CESAREAN SECTION    . LAPAROSCOPIC VAGINAL HYSTERECTOMY WITH SALPINGECTOMY Bilateral 01/29/2018   Procedure: LAPAROSCOPIC ASSISTED VAGINAL  HYSTERECTOMY WITH BILATERAL SALPINGECTOMY;  Surgeon: Harlin Heys, MD;  Location: [facility] ORS;  Service: Gynecology;  Laterality: Bilateral;  . TUBAL LIGATION  2010    Family History  Problem Relation Age of Onset  . Stroke Mother   . Multiple sclerosis Father   . Breast cancer Neg Hx     Social History   Socioeconomic History  . Marital status: Married    Spouse name: [name]   . Number of children: 3  . Years of education: Not on file  . Highest education level: Not on file  Occupational History  . Not on file  Social Needs  . Financial resource strain: Not hard at all  . Food insecurity:    Worry: Never true    Inability: Never true  . Transportation needs:    Medical: No    Non-medical: No  Tobacco Use  . Smoking status: Never Smoker  . Smokeless tobacco: Never Used  Substance and Sexual Activity  . Alcohol use: Not Currently  . Drug use: Never  . Sexual activity: Yes    Partners: Male    Birth control/protection: Surgical  Lifestyle  . Physical activity:    Days per week: 2 days    Minutes per session: 30 min  . Stress: Only a little  Relationships  . Social connections:    Talks on phone: More than three times a week    Gets together: More than three times a week    Attends religious service: More than 4 times per year    Active member of club or organization: Yes    Attends meetings of clubs or organizations: More than 4 times per year    Relationship status: Married  . Intimate partner violence:    Fear of current or ex partner: No    Emotionally abused: No    Physically abused: No    Forced sexual activity: No  Other Topics Concern  . Not on file  Social History Narrative  . Not on file     Current Outpatient Medications:  .  gabapentin (NEURONTIN) 600 MG tablet, TAKE 1 TABLET BY MOUTH 3 (THREE) TIMES DAILY. TAKE AS DIRECTED BUILDING UP TO 3 TIMES PER DAY, Disp: 90 tablet, Rfl: 1 .  lisinopril (PRINIVIL,ZESTRIL) 10 MG tablet, Take 1 tablet  (10 mg total) by mouth daily. (Patient taking differently: Take 10 mg by mouth at bedtime. ), Disp: 90 tablet, Rfl: 1 .  nortriptyline (PAMELOR) 10 MG capsule, Take 30 mg by mouth at bedtime. , Disp: , Rfl:  .  rizatriptan (MAXALT) 10 MG tablet, Take 10 mg by mouth as needed for migraine. May repeat in 2 hours if needed, Disp: , Rfl:  .  topiramate (TOPAMAX) 25 MG tablet, Take 25 mg by mouth See admin instructions. Take 25 mg by mouth daily on cycle days 1-5, Disp: , Rfl:  .  oxyCODONE (OXY IR/ROXICODONE) 5  MG immediate release tablet, Take 1-2 tablets (5-10 mg total) by mouth every 4 (four) hours as needed for severe pain. (Patient not taking: Reported on 03/06/2018), Disp: 20 tablet, Rfl: 0  Allergies  Allergen Reactions  . Acetaminophen Itching, Swelling and Other (See Comments)    Also notes BURNING  . Shellfish Allergy Itching and Other (See Comments)    Itching of tongue and throat    I personally reviewed active problem list, medication list, allergies, health maintenance, notes from last encounter, lab results with the patient/caregiver today.   ROS Constitutional: Negative for fever or weight change.  Respiratory: Negative for cough and shortness of breath.   Cardiovascular: Negative for chest pain or palpitations.  Gastrointestinal: Negative for abdominal pain, no bowel changes.  Musculoskeletal: Negative for gait problem or joint swelling.  Skin: Negative for rash.  Neurological: Negative for dizziness or headache.  No other specific complaints in a complete review of systems (except as listed in HPI above).  Objective  Virtual encounter, vitals not obtained.  There is no height or weight on file to calculate BMI.  Physical Exam  Constitutional: Patient appears well-developed and well-nourished. No distress.  HENT: Head: Normocephalic and atraumatic.  Neck: Normal range of motion. Pulmonary/Chest: Effort normal. No respiratory distress. Speaking in complete sentences  Neurological: Pt is alert and oriented to person, place, and time. Coordination, speech and gait are normal.  Psychiatric: Patient has a normal mood and affect. behavior is normal. Judgment and thought content normal.    No results found for this or any previous visit (from the past 72 hour(s)).  PHQ2/9: Depression screen [facility] 2/9 06/18/2018 12/18/2017 12/04/2017  Decreased Interest 0 0 0  Down, Depressed, Hopeless 0 0 0  PHQ - 2 Score 0 0 0  Altered sleeping 0 0 0  Tired, decreased energy 0 0 0  Change in appetite 0 0 0  Feeling bad or failure about yourself  0 0 0  Trouble concentrating 0 0 0  Moving slowly or fidgety/restless 0 0 0  Suicidal thoughts 0 0 0  PHQ-9 Score 0 0 0  Difficult doing work/chores Not difficult at all Not difficult at all Not difficult at all   PHQ-2/9 Result is negative.    Fall Risk: Fall Risk  06/18/2018 12/18/2017 12/04/2017  Falls in the past year? 0 0 No  Number falls in past yr: 0 - -  Injury with Fall? 0 - -  Follow up Falls evaluation completed - -    Assessment & Plan  1. Hypertension, unspecified type - DASH diet - Comprehensive metabolic panel - lisinopril (ZESTRIL) 10 MG tablet; Take 1 tablet (10 mg total) by mouth at bedtime.  Dispense: 90 tablet; Refill: 3  2. Overweight (BMI 25.0-29.9) Discussed importance of 150 minutes of physical activity weekly, eat two servings of fish weekly, eat one serving of tree nuts ( cashews, pistachios, pecans, almonds.Marland Kitchen) every other day, eat 6 servings of fruit/vegetables daily and drink plenty of water and avoid sweet beverages.  - Comprehensive metabolic panel  3. Migraine without status migrainosus, not intractable, unspecified migraine type - Improving with hysterectomy; continue with [facility] PA-C/Dr. Melrose Nakayama  4. Need for hepatitis C screening test - Hepatitis C antibody  5. Encounter for screening for HIV - HIV Antibody (routine testing w rflx)  6. Insomnia, unspecified type - Stable  7.  Vaginal candidiasis - fluconazole (DIFLUCAN) 150 MG tablet; Take 1 tablets once.  May repeat in 72 hours if not improving.  Dispense:  2 tablet; Refill: 0  8. Non-seasonal allergic rhinitis, unspecified trigger - levocetirizine (XYZAL) 5 MG tablet; Take 1 tablet (5 mg total) by mouth every evening.  Dispense: 90 tablet; Refill: 1 - fluticasone (FLONASE) 50 MCG/ACT nasal spray; Place 2 sprays into both nostrils daily.  Dispense: 16 g; Refill: 11 - benzonatate (TESSALON PERLES) 100 MG capsule; Take 1 capsule (100 mg total) by mouth 3 (three) times daily as needed.  Dispense: 60 capsule; Refill: 0  I discussed the assessment and treatment plan with the patient. The patient was provided an opportunity to ask questions and all were answered. The patient agreed with the plan and demonstrated an understanding of the instructions.  The patient was advised to call back or seek an in-person evaluation if the symptoms worsen or if the condition fails to improve as anticipated.  I provided 26 minutes of non-face-to-face time during this encounter.

## 2018-07-21 ENCOUNTER — Other Ambulatory Visit: Payer: Self-pay | Admitting: Family Medicine

## 2018-07-21 DIAGNOSIS — I10 Essential (primary) hypertension: Secondary | ICD-10-CM

## 2018-11-19 ENCOUNTER — Encounter: Payer: 59 | Admitting: Obstetrics and Gynecology

## 2018-11-20 ENCOUNTER — Encounter: Payer: Managed Care, Other (non HMO) | Admitting: Obstetrics and Gynecology

## 2018-11-27 ENCOUNTER — Encounter: Payer: Managed Care, Other (non HMO) | Admitting: Obstetrics and Gynecology

## 2018-11-30 ENCOUNTER — Other Ambulatory Visit: Payer: Self-pay

## 2018-11-30 ENCOUNTER — Encounter: Payer: Self-pay | Admitting: Podiatry

## 2018-11-30 ENCOUNTER — Ambulatory Visit: Payer: Managed Care, Other (non HMO) | Admitting: Podiatry

## 2018-11-30 DIAGNOSIS — B351 Tinea unguium: Secondary | ICD-10-CM

## 2018-11-30 DIAGNOSIS — M79676 Pain in unspecified toe(s): Secondary | ICD-10-CM | POA: Diagnosis not present

## 2018-11-30 MED ORDER — TERBINAFINE HCL 250 MG PO TABS: 250.0000 mg | ORAL_TABLET | Freq: Every day | ORAL | 2 refills | Status: DC

## 2018-12-03 NOTE — Progress Notes (Signed)
   Subjective: 48 y.o. female presenting today as a new patient with a chief complaint of possible nail fungus of the bilateral great toes that has been present for the past year. She reports associated discoloration and mild tenderness on the lateral side of the left great toenail. She has been trimming the nails for treatment but states they always grow back the same way. There are no modifying factors noted. Patient is here for further evaluation and treatment.   No past medical history on file.  Objective: Physical Exam General: The patient is alert and oriented x3 in no acute distress.  Dermatology: Hyperkeratotic, discolored, thickened, onychodystrophy noted to the bilateral great toenails. Skin is warm, dry and supple bilateral lower extremities. Negative for open lesions or macerations.  Vascular: Palpable pedal pulses bilaterally. No edema or erythema noted. Capillary refill within normal limits.  Neurological: Epicritic and protective threshold grossly intact bilaterally.   Musculoskeletal Exam: Range of motion within normal limits to all pedal and ankle joints bilateral. Muscle strength 5/5 in all groups bilateral.   Assessment: #1 Onychomycosis bilateral great toenails #2 Hyperkeratotic nails bilateral great toenails   Plan of Care:  #1 Patient was evaluated. #2 Prescription for Lamisil 250 mg provided to patient.  #3 Recommended good foot hygiene.  #4 Return to clinic as needed.   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    Edrick Kins, DPM [facility]  Dr. Edrick Kins, [address]                                        [place], [place] [address]                Office [phone]  Fax [phone]

## 2018-12-20 ENCOUNTER — Encounter: Payer: Managed Care, Other (non HMO) | Admitting: Family Medicine

## 2018-12-25 ENCOUNTER — Other Ambulatory Visit: Payer: Self-pay

## 2018-12-25 ENCOUNTER — Encounter: Payer: Self-pay | Admitting: Family Medicine

## 2018-12-25 ENCOUNTER — Ambulatory Visit: Payer: Managed Care, Other (non HMO) | Admitting: Family Medicine

## 2018-12-25 VITALS — BP 118/78 | HR 101 | Temp 97.3°F | Resp 14 | Wt 186.4 lb

## 2018-12-25 DIAGNOSIS — M25531 Pain in right wrist: Secondary | ICD-10-CM

## 2018-12-25 DIAGNOSIS — M25431 Effusion, right wrist: Secondary | ICD-10-CM | POA: Diagnosis not present

## 2018-12-25 MED ORDER — MELOXICAM 15 MG PO TABS: 15.0000 mg | ORAL_TABLET | Freq: Every day | ORAL | 0 refills | Status: DC

## 2018-12-25 NOTE — Progress Notes (Signed)
Patient ID: Shirley Harris, female    DOB: December 09, 1970, 48 y.o.   MRN: [id number]  PCP: Hubbard Hartshorn, FNP  Chief Complaint  Patient presents with  . Right Arm Pain    Subjective:   Shirley Harris is a 48 y.o. female, presents to clinic with CC of the following:  Right hand dominant female presents with acute on chronic right wrist pain that has been intermittent for about a year but recently worsened.    Wrist Pain  The pain is present in the right wrist. This is a chronic problem. The current episode started more than 1 year ago. There has been no history of extremity trauma. The problem occurs intermittently. The problem has been gradually worsening. The quality of the pain is described as aching. The pain is severe. Associated symptoms include joint swelling and a limited range of motion. Pertinent negatives include no fever, inability to bear weight, joint locking, numbness, stiffness or tingling. The symptoms are aggravated by activity. Treatments tried: rest, biofreeze. The treatment provided mild relief. Family history does not include gout or rheumatoid arthritis. There is no history of diabetes, gout, osteoarthritis or rheumatoid arthritis.  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  Her right wrist pain got severe this past week worse than it has ever been has radiated up to her elbow and even felt like it moved up to her shoulder with some pain under her armpit with some slight swelling noticed in her wrist decreased range of motion and decreased strength she had no redness or warmth.  It did improve a little bit from yesterday to today and pain is currently mild   Patient Active Problem List   Diagnosis Date Noted  . Fibroids, submucosal 01/29/2018  . Hypertension 12/04/2017  . Overweight (BMI 25.0-29.9) 12/04/2017  . Migraine 12/04/2017  . Insomnia 12/04/2017  . Chronic tension-type headache,  not intractable 12/02/2016  . Abnormal mammogram 05/14/2013      Current Outpatient Medications:  .  fluticasone (FLONASE) 50 MCG/ACT nasal spray, Place 2 sprays into both nostrils daily., Disp: 16 g, Rfl: 11 .  levocetirizine (XYZAL) 5 MG tablet, Take 1 tablet (5 mg total) by mouth every evening., Disp: 90 tablet, Rfl: 1 .  lisinopril (ZESTRIL) 10 MG tablet, TAKE 1 TABLET BY MOUTH EVERY DAY, Disp: 90 tablet, Rfl: 3 .  nortriptyline (PAMELOR) 10 MG capsule, Take 30 mg by mouth at bedtime. , Disp: , Rfl:  .  rizatriptan (MAXALT) 10 MG tablet, Take 10 mg by mouth as needed for migraine. May repeat in 2 hours if needed, Disp: , Rfl:  .  terbinafine (LAMISIL) 250 MG tablet, Take 1 tablet (250 mg total) by mouth daily., Disp: 30 tablet, Rfl: 2   Allergies  Allergen Reactions  . Acetaminophen Itching, Swelling and Other (See Comments)    Also notes BURNING  . Shellfish Allergy Itching and Other (See Comments)    Itching of tongue and throat     Family History  Problem Relation Age of Onset  . Stroke Mother   . Multiple sclerosis Father   . Breast cancer Neg Hx      Social History   Socioeconomic History  . Marital status: Married    Spouse name: [name]   . Number of children: 3  . Years of education: Not on file  . Highest education level: Not on file  Occupational  History  . Not on file  Social Needs  . Financial resource strain: Not hard at all  . Food insecurity    Worry: Never true    Inability: Never true  . Transportation needs    Medical: No    Non-medical: No  Tobacco Use  . Smoking status: Never Smoker  . Smokeless tobacco: Never Used  Substance and Sexual Activity  . Alcohol use: Not Currently  . Drug use: Never  . Sexual activity: Yes    Partners: Male    Birth control/protection: Surgical  Lifestyle  . Physical activity    Days per week: 2 days    Minutes per session: 30 min  . Stress: Only a little  Relationships  . Social connections    Talks  on phone: More than three times a week    Gets together: More than three times a week    Attends religious service: More than 4 times per year    Active member of club or organization: Yes    Attends meetings of clubs or organizations: More than 4 times per year    Relationship status: Married  . Intimate partner violence    Fear of current or ex partner: No    Emotionally abused: No    Physically abused: No    Forced sexual activity: No  Other Topics Concern  . Not on file  Social History Narrative  . Not on file    I personally reviewed active problem list, medication list, allergies, lab results with the patient/caregiver today.  Review of Systems  Constitutional: Negative.  Negative for fever.  HENT: Negative.   Eyes: Negative.   Respiratory: Negative.   Cardiovascular: Negative.   Gastrointestinal: Negative.   Endocrine: Negative.   Genitourinary: Negative.   Musculoskeletal: Negative.  Negative for gout and stiffness.  Skin: Negative.   Allergic/Immunologic: Negative.   Neurological: Negative.  Negative for tingling and numbness.  Hematological: Negative.   Psychiatric/Behavioral: Negative.   All other systems reviewed and are negative.      Objective:   Vitals:   12/25/18 1453  BP: 118/78  Pulse: (!) 101  Resp: 14  Temp: (!) 97.3 F (36.3 C)  SpO2: 99%  Weight: 186 lb 6.4 oz (84.6 kg)    Body mass index is 30.09 kg/m.  Physical Exam Vitals signs and nursing note reviewed.  Constitutional:      Appearance: She is well-developed.  HENT:     Head: Normocephalic and atraumatic.     Nose: Nose normal.  Eyes:     General:        Right eye: No discharge.        Left eye: No discharge.     Conjunctiva/sclera: Conjunctivae normal.  Neck:     Trachea: No tracheal deviation.  Cardiovascular:     Rate and Rhythm: Normal rate and regular rhythm.  Pulmonary:     Effort: Pulmonary effort is normal. No respiratory distress.     Breath sounds: No stridor.   Musculoskeletal:     Right wrist: She exhibits decreased range of motion and swelling (very slight swelling when compared to left hand and wrist). She exhibits no tenderness, no bony tenderness, no effusion, no crepitus, no deformity and no laceration.     Comments: Right grip strength slightly decreased when compared to the left Normal sensation to bilateral hands Mildly positive phalen's test to right Negative tinel's  Skin:    General: Skin is warm and dry.  Findings: No rash.  Neurological:     Mental Status: She is alert.     Motor: No abnormal muscle tone.     Coordination: Coordination normal.  Psychiatric:        Behavior: Behavior normal.      Results for orders placed or performed during the hospital encounter of 01/29/18  CBC  Result Value Ref Range   WBC 8.6 4.0 - 10.5 K/uL   RBC 4.52 3.87 - 5.11 MIL/uL   Hemoglobin 12.8 12.0 - 15.0 g/dL   HCT 39.7 36.0 - 46.0 %   MCV 87.8 80.0 - 100.0 fL   MCH 28.3 26.0 - 34.0 pg   MCHC 32.2 30.0 - 36.0 g/dL   RDW 13.2 11.5 - 15.5 %   Platelets 346 150 - 400 K/uL   nRBC 0.0 0.0 - 0.2 %  hCG, quantitative, pregnancy  Result Value Ref Range   hCG, Beta Chain, Quant, S <1 <5 mIU/mL  Pregnancy, urine POC  Result Value Ref Range   Preg Test, Ur NEGATIVE NEGATIVE  Type and screen  Result Value Ref Range   ABO/RH(D) A POS    Antibody Screen NEG    Sample Expiration      02/01/2018 Performed at [facility], [address] [place], [place] [address]   ABO/Rh  Result Value Ref Range   ABO/RH(D)      A POS Performed at [facility], [address] [place], [place] [address]   Surgical pathology  Result Value Ref Range   SURGICAL PATHOLOGY      Surgical Pathology CASE: [id number] PATIENT: Martha Clan Koeller Surgical Pathology Report     SPECIMEN SUBMITTED: A. Uterus with cervix, bilateral tubes  CLINICAL HISTORY: None provided  PRE-OPERATIVE DIAGNOSIS: Dysfunctional uterine bleeding,  fibroids, dysmenorrhea  POST-OPERATIVE DIAGNOSIS: Same as pre-op     DIAGNOSIS: A.  UTERUS WITH CERVIX AND BILATERAL FALLOPIAN TUBE; TOTAL HYSTERECTOMY WITH BILATERAL SALPINGECTOMY: - CHRONIC CERVICITIS. - SECRETORY ENDOMETRIUM WITH NON-SPECIFIC CHRONIC ENDOMETRITIS. - MYOMETRIUM WITH INTRAMURAL LEIOMYOMATA, LARGEST MEASURING 2.0 CM. - BILATERAL FALLOPIAN TUBES WITH FIMBRIATED END AND BENIGN PARATUBAL CYST. - NEGATIVE FOR ATYPIA AND MALIGNANCY.   GROSS DESCRIPTION: A. Labeled: Uterus with cervix, bilateral tubes Received: Formalin Weight: 187 grams (total) Dimensions:      Uterus - 7.7 x 7.7 x 5.0 cm      Cervix - 4.0 x 3.7 x 3.1 cm Serosa: Tan-pink and smooth Cervix: The ectocervical mucosa is pale tan and s mooth.  The external os is slit-like, patent, and 0.7 cm in diameter. Endocervix: 3.5 cm in length by 0.7 cm in diameter.  The endocervical mucosa is tan, striated, and grossly unremarkable. Endometrial cavity:      Dimensions - 6.5 cm in length by 3.0 cm cornu to cornu width      Thickness - 0.1-0.3 cm      Other findings - Tan and smooth with no polyps or abnormalities grossly identified. Myometrium:     Thickness - 2.3 cm (average)     Other findings - Sectioning displays multiple, well-circumscribed, intramural nodules ranging from 0.4 to 2.0 cm in greatest dimension. All of the nodules display a pale-tan, whorled cut surface.  Sectioning the remainder of the myometrium displays a tan-pink, slightly trabeculated cut surface with no additional abnormalities grossly identified. Adnexa:      Fallopian tube           Measurements - 5.0 cm in length x 0.5 cm in diameter  Other findings - Received detached and unoriented.  Fimbria are present.  The external  surface is tan-pink and smooth.  Sectioning displays a pinpoint, grossly unremarkable lumen.      Opposite fallopian tube            Measurements - 6.2 cm in length x 0.6 cm in diameter            Other findings - Received detached and unoriented.  Fimbria are present.  Attached to the external surface is a 3.2 cm in greatest dimension smooth-walled, paratubal cyst containing clear fluid.  The remaining external surface is tan-purple and smooth.  Sectioning displays a pinpoint, grossly unremarkable lumen.  Block summary: 1 - anterior cervix 2 - posterior cervix 3 - anterior endomyometrium 4 - posterior endomyometrium 5-7 - myometrial nodules 8 - fallopian tube with longitudinally sectioned fimbria and cross-sections 9 - opposite fallopian tube with longitudinally sectioned fimbria and cross-sections with paratubal cyst   Final Diagnosis performed by Quay Burow, MD.   Electronically signed 01/30/2018 4:02:37PM The electronic signature indicates that the named At tending Pathologist has evaluated the specimen  Technical component performed at [facility], [address], [place], [place] [address] Lab: [phone] Dir: Rush Farmer, MD, MMM  Professional component performed at [facility], [facility], [place], [place], [place] [address] Lab: [phone] Dir: Dellia Nims. Reuel Derby, MD         Assessment & Plan:      ICD-10-CM   1. Pain and swelling of right wrist  M25.531 Ambulatory referral to Hand Surgery   M25.431     Prescription for a right wrist cock up brace -encourage patient to rest in brace, ice, use NSAIDs, adjust her body mechanics when typing if able.  We discussed starting with conservative measures and seeing if she improves but since she has had intermittent and gradually worsening pain and now swelling for the past year she would like to also initiate referral to hand surgeon for further evaluation    Delsa Grana, PA-C 12/25/18 3:07 PM

## 2018-12-25 NOTE — Patient Instructions (Addendum)
Carpal Tunnel Syndrome  Carpal tunnel syndrome is a condition that causes pain in your hand and arm. The carpal tunnel is a narrow area that is on the palm side of your wrist. Repeated wrist motion or certain diseases may cause swelling in the tunnel. This swelling can pinch the main nerve in the wrist (median nerve). What are the causes? This condition may be caused by:  Repeated wrist motions.  Wrist injuries.  Arthritis.  A sac of fluid (cyst) or abnormal growth (tumor) in the carpal tunnel.  Fluid buildup during pregnancy. Sometimes the cause is not known. What increases the risk? The following factors may make you more likely to develop this condition:  Having a job in which you move your wrist in the same way many times. This includes jobs like being a butcher or a cashier.  Being a woman.  Having other health conditions, such as: ? Diabetes. ? Obesity. ? A thyroid gland that is not active enough (hypothyroidism). ? Kidney failure. What are the signs or symptoms? Symptoms of this condition include:  A tingling feeling in your fingers.  Tingling or a loss of feeling (numbness) in your hand.  Pain in your entire arm. This pain may get worse when you bend your wrist and elbow for a long time.  Pain in your wrist that goes up your arm to your shoulder.  Pain that goes down into your palm or fingers.  A weak feeling in your hands. You may find it hard to grab and hold items. You may feel worse at night. How is this diagnosed? This condition is diagnosed with a medical history and physical exam. You may also have tests, such as:  Electromyogram (EMG). This test checks the signals that the nerves send to the muscles.  Nerve conduction study. This test checks how well signals pass through your nerves.  Imaging tests, such as X-rays, ultrasound, and MRI. These tests check for what might be the cause of your condition. How is this treated? This condition may be treated  with:  Lifestyle changes. You will be asked to stop or change the activity that caused your problem.  Doing exercise and activities that make bones and muscles stronger (physical therapy).  Learning how to use your hand again (occupational therapy).  Medicines for pain and swelling (inflammation). You may have injections in your wrist.  A wrist splint.  Surgery. Follow these instructions at home: If you have a splint:  Wear the splint as told by your doctor. Remove it only as told by your doctor.  Loosen the splint if your fingers: ? Tingle. ? Lose feeling (become numb). ? Turn cold and blue.  Keep the splint clean.  If the splint is not waterproof: ? Do not let it get wet. ? Cover it with a watertight covering when you take a bath or a shower. Managing pain, stiffness, and swelling   If told, put ice on the painful area: ? If you have a removable splint, remove it as told by your doctor. ? Put ice in a plastic bag. ? Place a towel between your skin and the bag. ? Leave the ice on for 20 minutes, 2-3 times per day. General instructions  Take over-the-counter and prescription medicines only as told by your doctor.  Rest your wrist from any activity that may cause pain. If needed, talk with your boss at work about changes that can help your wrist heal.  Do any exercises as told by your doctor,   physical therapist, or occupational therapist.  Keep all follow-up visits as told by your doctor. This is important. Contact a doctor if:  You have new symptoms.  Medicine does not help your pain.  Your symptoms get worse. Get help right away if:  You have very bad numbness or tingling in your wrist or hand. Summary  Carpal tunnel syndrome is a condition that causes pain in your hand and arm.  It is often caused by repeated wrist motions.  Lifestyle changes and medicines are used to treat this problem. Surgery may help in very bad cases.  Follow your doctor's  instructions about wearing a splint, resting your wrist, keeping follow-up visits, and calling for help. This information is not intended to replace advice given to you by your health care provider. Make sure you discuss any questions you have with your health care provider. Document Released: 01/13/2011 Document Revised: 06/02/2017 Document Reviewed: 06/02/2017 Elsevier Patient Education  [place].   Wrist Splint, Adult A wrist splint holds your wrist in a position so it does not move. A splint supports your wrist like a cast, but it is not stiff like a cast (is flexible). You can take it off or make it looser. You may need a wrist splint if you hurt your wrist or have swelling in your wrist. A splint can:  Support your wrist.  Protect your wrist when it is hurt (injured).  Help you not hurt your wrist again.  Make your wrist stay still and not move.  Lessen pain.  Help your wrist heal. It is important to wear your splint as told by your doctor. This helps you make sure that your wrist heals the right way. What are the risks? If you wear your splint too tight or you have a lot of swelling, blood may not be able to go to your wrist or hand. If this happens, you can get a condition called compartment syndrome. It can be dangerous and cause damage that lasts. Symptoms are:  Pain in your wrist that gets worse.  Tingling.  Having no feeling in your wrist or hand. This is called numbness.  Changes in skin color. The skin may look very light (pale) or kind of blue.  Cold fingers. Other risks of wearing a splint may be:  A stiff wrist.  A weak wrist.  Skin irritation that can cause: ? Itching. ? Rash. ? Sores. ? Infection. How to use your wrist splint Your wrist splint should be tight enough to support your wrist. It should not block blood from going to your hand or wrist. Your doctor will tell you how to wear your wrist splint and how long to wear it. Splint wear   Wear the splint as told by your doctor. Only take it off as told by your doctor.  Loosen the splint if your fingers tingle, get numb, or turn cold and blue.  Keep the splint clean.  If the splint is not waterproof: ? Do not let it get wet. ? Cover it with a watertight covering when you take a bath or a shower  Do not stick anything inside the splint to scratch your skin. Doing that increases your risk of infection.  Check the skin under the splint every time you take it off. Check for any redness or blisters. Tell your doctor about any skin problems. Managing pain, stiffness, and swelling   If directed, put ice on the injured area. ? If you a have a splint  that can be taken off, take it off as told by your doctor. ? Put ice in a plastic bag. ? Place a towel between your skin and the bag. ? Leave the ice on for 20 minutes, 2-3 times a day.  Move your fingers often to avoid stiffness and to lessen swelling.  Raise (elevate) the injured area above the level of your heart while you are sitting or lying down. Activity  Return to your normal activities as told by your doctor. Ask your doctor what activities are safe for you.  Do exercises as told by your doctor.  Ask your doctor when it is safe to drive with a splint on your wrist. General instructions  Do not use the injured limb to support (bear) your body weight until your doctor says that you can.  Do not put pressure on any part of the splint until it is fully hardened. This may take many hours.  Do not use any products that have nicotine or tobacco in them, such as cigarettes and e-cigarettes. If you need help quitting, ask your doctor.  Take over-the-counter and prescription medicines only as told by your doctor.  Keep all follow-up visits as told by your doctor. This is important. Get help if:  You have wrist pain or swelling that does not go away.  The skin around or under your splint gets red, itchy, or moist.  You  have chills or fever.  Your splint feels too tight or too loose.  Your splint breaks. Get help right away if:  You have pain that gets worse.  You have tingling and numbness.  You have changes in skin color, including paleness or a bluish color.  Your fingers are cold. Summary  A wrist splint is a flexible device that supports your wrist and keeps your wrist from moving.  It is important to wear your splint as told by your doctor. This helps to make sure that your wrist heals correctly.  Icing, moving your fingers, and raising your wrist above the level of your heart will help you manage pain, stiffness, and swelling.  Your wrist splint should be tight enough to support your wrist. It should not block your blood supply.  Get help right away if your fingers tingle, get numb, or turn cold and blue. Loosen the splint right away. This information is not intended to replace advice given to you by your health care provider. Make sure you discuss any questions you have with your health care provider. Document Released: 07/13/2007 Document Revised: 05/14/2018 Document Reviewed: 04/13/2016 Elsevier Patient Education  2020 [employer].

## 2019-01-08 ENCOUNTER — Other Ambulatory Visit: Payer: Self-pay

## 2019-01-08 ENCOUNTER — Encounter: Payer: Self-pay | Admitting: Family Medicine

## 2019-01-08 ENCOUNTER — Ambulatory Visit (INDEPENDENT_AMBULATORY_CARE_PROVIDER_SITE_OTHER): Payer: Managed Care, Other (non HMO) | Admitting: Family Medicine

## 2019-01-08 VITALS — BP 118/84 | HR 100 | Temp 97.1°F | Resp 16 | Ht 66.0 in | Wt 188.2 lb

## 2019-01-08 DIAGNOSIS — E663 Overweight: Secondary | ICD-10-CM

## 2019-01-08 DIAGNOSIS — Z113 Encounter for screening for infections with a predominantly sexual mode of transmission: Secondary | ICD-10-CM

## 2019-01-08 DIAGNOSIS — Z0000 Encounter for general adult medical examination without abnormal findings: Secondary | ICD-10-CM

## 2019-01-08 DIAGNOSIS — Z13220 Encounter for screening for lipoid disorders: Secondary | ICD-10-CM

## 2019-01-08 DIAGNOSIS — Z1231 Encounter for screening mammogram for malignant neoplasm of breast: Secondary | ICD-10-CM

## 2019-01-08 DIAGNOSIS — Z1212 Encounter for screening for malignant neoplasm of rectum: Secondary | ICD-10-CM

## 2019-01-08 DIAGNOSIS — Z1159 Encounter for screening for other viral diseases: Secondary | ICD-10-CM

## 2019-01-08 DIAGNOSIS — R002 Palpitations: Secondary | ICD-10-CM | POA: Diagnosis not present

## 2019-01-08 DIAGNOSIS — Z1211 Encounter for screening for malignant neoplasm of colon: Secondary | ICD-10-CM

## 2019-01-08 DIAGNOSIS — I10 Essential (primary) hypertension: Secondary | ICD-10-CM | POA: Diagnosis not present

## 2019-01-08 MED ORDER — METOPROLOL SUCCINATE ER 25 MG PO TB24: 25.0000 mg | ORAL_TABLET | Freq: Every day | ORAL | 3 refills | Status: DC

## 2019-01-08 NOTE — Progress Notes (Signed)
Name: Shirley Harris   MRN: [id number]    DOB: [birth year]   Date:01/08/2019       Progress Note  Subjective  Chief Complaint  Chief Complaint  Patient presents with  . Annual Exam    HPI  Patient presents for annual CPE and follow up:  HTN: Not checking at home, BP is at goal today. -does take medications as prescribed - current regimen includes Lisinopril 27m.  - taking medications as instructed, no medication side effects noted, no TIAs, no chest pain on exertion, no swelling of ankles. See below regarding palpitaitons - DASH diet discussed - pt does follow a low sodium diet; salt not added to cooking and salt shaker not on table.  Insomnia and palpitations: Has history of difficulty falling asleep, but lately has had palpitations as well.  She feels like her heart races at night with some palpitations that makes her anxious to fall asleep.  She notes her husband took her pulse recently during an episode of this and he reported HR of 160, episodes last less than an hour.  Has had 2 episodes in the last 7 days.  We'll obtain EKG today, obtain labs, refer to Cardiology, and start on low dose metoprolol ER.  Denies chest pain, lightheadedness, confusion.  Does get some shortness of breath.  Chronic Migraines- seeing Dr. PMelrose Nakayama sometimes has cluster headaches; she is taking maxalt PRN (4 tablets with her migraines that occur once monthly) and nortriptyline. Her headaches have improved since her hysterectomy, but does still get headaches around the time of when her period would occur (has ovaries intact). She did have an episode in 2019 of complex migraines. Triggers aside from hormonal changes - sweets, and high sodium foods.  AR: Taking clairitn PRN for AR, has been several months since she has needed this. Usually worse in the summer or when the weather changes quickly, the fall has been very tolerable.  Currently taking Xyzal and flonase PRN and this regimen works well for her.    Overweight: She is working out in her garage and walking in the neighborhood.  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 Has cut down drastically on sweet beverages - only has 1-2 sodas each week; still drinking some sweet tea. Drinking more water. Last A1C was 5.5% with [facility], needs to repeat labs - never went back in May. Body mass index is 30.38 kg/m.   USPSTF grade A and B recommendations    [facility]  0    Occasional glass of wine.  Depression: Phq 9 is  negative [facility] 01/08/2019 12/25/2018 06/18/2018 12/18/2017 12/04/2017  Decreased Interest 0 0 0 0 0  Down, Depressed, Hopeless 0 0 0 0 0  PHQ - 2 Score 0 0 0 0 0  Altered sleeping 0 0 0 0 0  Tired, decreased energy 0 0 0 0 0  Change in appetite 0 0 0 0 0  Feeling bad or failure about yourself  0 0 0 0 0  Trouble concentrating 0 0 0 0 0  Moving slowly or fidgety/restless 0 0 0 0 0  Suicidal thoughts 0 0 0 0 0  PHQ-9 Score 0 0 0 0 0  Difficult doing work/chores Not difficult at all Not difficult at  all Not difficult at all Not difficult at all Not difficult at all   Hypertension: BP Readings from Last 3 Encounters:  01/08/19 118/84  12/25/18 118/78  03/06/18 129/86   Obesity: Wt Readings from Last 3 Encounters:  01/08/19 188 lb 3.2 oz (85.4 kg)  12/25/18 186 lb 6.4 oz (84.6 kg)  06/05/18 168 lb (76.2 kg)   BMI Readings from Last 3 Encounters:  01/08/19 30.38 kg/m  12/25/18 30.09 kg/m  06/05/18 27.12 kg/m     Hep C Screening: We will do today STD testing and prevention (HIV/chl/gon/syphilis): We will check today Intimate partner violence: No concerns Sexual History/Pain during Intercourse: No concerns Menstrual History/LMP/Abnormal Bleeding: S/p hysterectomy, no bleeding since  hysterectomy Incontinence Symptoms: No concerns  Breast cancer:  - Last Mammogram: 2019, due for follow  - BRCA gene screening:   Osteoporosis: discussed high calcium and vitamin D supplementation  Cervical cancer screening: s/p hysterectomy, followed by GYN  Skin cancer: discussed atypical lesion monitoring.  No concerning lesions Colorectal cancer: Denies family or personal history of colorectal cancer, no changes in BM's - no blood in stool, dark and tarry stool, mucus in stool, or constipation/diarrhea. Lung cancer:  Never smoker. Low Dose CT Chest recommended if Age 89-80 years, 30 pack-year currently smoking OR have quit w/in 15years. Patient does not qualify.   ECG: We will obtain today, see above.  Advanced Care Planning: A voluntary discussion about advance care planning including the explanation and discussion of advance directives.  Discussed health care proxy and Living will, and the patient was able to identify a health care proxy as Husband Donna Christen).  Patient does not have a living will at present time. If patient does have living will, I have requested they bring this to the clinic to be scanned in to their chart.  Lipids: Lab Results  Component Value Date   CHOL 212 (H) 01/18/2018   Lab Results  Component Value Date   HDL 74 01/18/2018   Lab Results  Component Value Date   LDLCALC 125 (H) 01/18/2018   Lab Results  Component Value Date   TRIG 67 01/18/2018   Lab Results  Component Value Date   CHOLHDL 2.9 01/18/2018   No results found for: LDLDIRECT  Glucose: Glucose, Bld  Date Value Ref Range Status  11/30/2016 91 65 - 99 mg/dL Final    Patient Active Problem List   Diagnosis Date Noted  . Fibroids, submucosal 01/29/2018  . Hypertension 12/04/2017  . Overweight (BMI 25.0-29.9) 12/04/2017  . Migraine 12/04/2017  . Insomnia 12/04/2017  . Chronic tension-type headache, not intractable 12/02/2016  . Abnormal mammogram 05/14/2013    Past  Surgical History:  Procedure Laterality Date  . BREAST BIOPSY Right 2015   Benign breast tissue with cystic and papillary apically metaplasia  . CESAREAN SECTION    . LAPAROSCOPIC VAGINAL HYSTERECTOMY WITH SALPINGECTOMY Bilateral 01/29/2018   Procedure: LAPAROSCOPIC ASSISTED VAGINAL HYSTERECTOMY WITH BILATERAL SALPINGECTOMY;  Surgeon: Harlin Heys, MD;  Location: [facility] ORS;  Service: Gynecology;  Laterality: Bilateral;  . TUBAL LIGATION  2010    Family History  Problem Relation Age of Onset  . Stroke Mother   . Multiple sclerosis Father   . Breast cancer Neg Hx     Social History   Socioeconomic History  . Marital status: Married    Spouse name: [name]   . Number of children: 3  . Years of education: Not on file  . Highest education level: Not on file  Occupational History  . Not on file  Social Needs  . Financial resource strain: Not hard at all  . Food insecurity    Worry: Never true    Inability: Never true  . Transportation needs    Medical: No    Non-medical: No  Tobacco Use  . Smoking status: Never Smoker  . Smokeless tobacco: Never Used  Substance and Sexual Activity  . Alcohol use: Not Currently  . Drug use: Never  . Sexual activity: Yes    Partners: Male    Birth control/protection: Surgical  Lifestyle  . Physical activity    Days per week: 2 days    Minutes per session: 30 min  . Stress: Only a little  Relationships  . Social connections    Talks on phone: More than three times a week    Gets together: Never    Attends religious service: More than 4 times per year    Active member of club or organization: Yes    Attends meetings of clubs or organizations: More than 4 times per year    Relationship status: Married  . Intimate partner violence    Fear of current or ex partner: No    Emotionally abused: No    Physically abused: No    Forced sexual activity: No  Other Topics Concern  . Not on file  Social History Narrative  . Not on file      Current Outpatient Medications:  .  fluticasone (FLONASE) 50 MCG/ACT nasal spray, Place 2 sprays into both nostrils daily., Disp: 16 g, Rfl: 11 .  levocetirizine (XYZAL) 5 MG tablet, Take 1 tablet (5 mg total) by mouth every evening., Disp: 90 tablet, Rfl: 1 .  lisinopril (ZESTRIL) 10 MG tablet, TAKE 1 TABLET BY MOUTH EVERY DAY, Disp: 90 tablet, Rfl: 3 .  meloxicam (MOBIC) 15 MG tablet, Take 1 tablet (15 mg total) by mouth daily., Disp: 30 tablet, Rfl: 0 .  nortriptyline (PAMELOR) 10 MG capsule, Take 30 mg by mouth at bedtime. , Disp: , Rfl:  .  rizatriptan (MAXALT) 10 MG tablet, Take 10 mg by mouth as needed for migraine. May repeat in 2 hours if needed, Disp: , Rfl:  .  terbinafine (LAMISIL) 250 MG tablet, Take 1 tablet (250 mg total) by mouth daily., Disp: 30 tablet, Rfl: 2  Allergies  Allergen Reactions  . Acetaminophen Itching, Swelling and Other (See Comments)    Also notes BURNING  . Shellfish Allergy Itching and Other (See Comments)    Itching of tongue and throat     ROS  Constitutional: Negative for fever or weight change.  Respiratory: Negative for cough.  Mild shortness of breath with palpitations as described above. Cardiovascular: Negative for chest pain.  See above regarding palpitations  Gastrointestinal: Negative for abdominal pain, no bowel changes.  Musculoskeletal: Negative for gait problem or joint swelling.  Skin: Negative for rash.  Neurological: Negative for dizziness or headache.  No other specific complaints in a complete review of systems (except as listed in HPI above).  Objective  Vitals:   01/08/19 1310  BP: 118/84  Pulse: 100  Resp: 16  Temp: (!) 97.1 F (36.2 C)  TempSrc: Temporal  SpO2: 99%  Weight: 188 lb 3.2 oz (85.4 kg)  Height: _0  (1.676 m)    Body mass index is 30.38 kg/m.  Physical Exam  Constitutional: Patient appears well-developed and well-nourished. No distress.  HENT: Head: Normocephalic and atraumatic. Ears: B  TMs ok, no  erythema or effusion; Nose: Nose normal. Mouth/Throat: Oropharynx is clear and moist. No oropharyngeal exudate.  Eyes: Conjunctivae and EOM are normal. Pupils are equal, round, and reactive to light. No scleral icterus.  Neck: Normal range of motion. Neck supple. No JVD present. No thyromegaly present.  Cardiovascular: Normal rate, regular rhythm and normal heart sounds.  No murmur heard. No BLE edema. Pulmonary/Chest: Effort normal and breath sounds normal. No respiratory distress. Abdominal: Soft. Bowel sounds are normal, no distension. There is no tenderness. no masses Breast: no lumps or masses, no nipple discharge or rashes FEMALE GENITALIA: Deferred Musculoskeletal: Normal range of motion, no joint effusions. No gross deformities Neurological: he is alert and oriented to person, place, and time. No cranial nerve deficit. Coordination, balance, strength, speech and gait are normal.  Skin: Skin is warm and dry. No rash noted. No erythema.  Psychiatric: Patient has a normal mood and affect. behavior is normal. Judgment and thought content normal.   No results found for this or any previous visit (from the past 2160 hour(s)).   Fall Risk: Fall Risk  01/08/2019 12/25/2018 06/18/2018 12/18/2017 12/04/2017  Falls in the past year? 0 0 0 0 No  Number falls in past yr: 0 0 0 - -  Injury with Fall? 0 0 0 - -  Follow up Falls evaluation completed - Falls evaluation completed - -   Assessment & Plan  1. Well woman exam (no gynecological exam) -USPSTF grade A and B recommendations reviewed with patient; age-appropriate recommendations, preventive care, screening tests, etc discussed and encouraged; healthy living encouraged; see AVS for patient education given to patient -Discussed importance of 150 minutes of physical activity weekly, eat two servings of fish weekly, eat one serving of tree nuts ( cashews, pistachios, pecans, almonds.Marland Kitchen) every other day, eat 6 servings of  fruit/vegetables daily and drink plenty of water and avoid sweet beverages.   2. Hypertension, unspecified type - Comprehensive metabolic panel  3. Palpitations - EKG 12-Lead - Ambulatory referral to Cardiology - metoprolol succinate (TOPROL-XL) 25 MG 24 hr tablet; Take 1 tablet (25 mg total) by mouth daily.  Dispense: 90 tablet; Refill: 3 - TSH - CBC with Differential/Platelet - Comprehensive metabolic panel  4. Overweight (BMI 25.0-29.9) - Comprehensive metabolic panel  5. Routine screening for STI (sexually transmitted infection) - HIV Antibody (routine testing w rflx) - RPR - GC/Chlamydia Probe Amp([facility])  6. Encounter for hepatitis C screening test for low risk patient - Hepatitis C antibody  7. Lipid screening - Lipid panel  8. Encounter for screening mammogram for malignant neoplasm of breast - MM 3D SCREEN BREAST BILATERAL; Future  9. Encounter for colorectal cancer screening - Ambulatory referral to Gastroenterology

## 2019-01-08 NOTE — Patient Instructions (Addendum)
Schedule your mammogram with [facility].  Take metoprolol 1 tablet at night.  Monitor blood pressure once daily - if <105/65 (either number), stop the metoprolol and call our office.  If lightheaded or dizziness, stop metoprolol and call us.

## 2019-01-18 ENCOUNTER — Ambulatory Visit: Payer: Managed Care, Other (non HMO) | Admitting: Cardiology

## 2019-01-21 ENCOUNTER — Telehealth: Payer: Self-pay

## 2019-01-21 ENCOUNTER — Other Ambulatory Visit: Payer: Self-pay

## 2019-01-21 DIAGNOSIS — Z1211 Encounter for screening for malignant neoplasm of colon: Secondary | ICD-10-CM

## 2019-01-21 MED ORDER — NA SULFATE-K SULFATE-MG SULF 17.5-3.13-1.6 GM/177ML PO SOLN: 354.0000 mL | Freq: Once | ORAL | 0 refills | Status: AC

## 2019-01-21 NOTE — Telephone Encounter (Signed)
Gastroenterology Pre-Procedure Review    PATIENT REVIEW QUESTIONS: The patient responded to the following health history questions as indicated:    1. Are you having any GI issues? no 2. Do you have a personal history of Polyps? no 3. Do you have a family history of Colon Cancer or Polyps? no 4. Diabetes Mellitus? no 5. Joint replacements in the past 12 months?no 6. Major health problems in the past 3 months?no 7. Any artificial heart valves, MVP, or defibrillator?no    MEDICATIONS & ALLERGIES:    Patient reports the following regarding taking any anticoagulation/antiplatelet therapy:   Plavix, Coumadin, Eliquis, Xarelto, Lovenox, Pradaxa, Brilinta, or Effient? no Aspirin? no  Patient confirms/reports the following medications:  Current Outpatient Medications  Medication Sig Dispense Refill  . fluticasone (FLONASE) 50 MCG/ACT nasal spray Place 2 sprays into both nostrils daily. 16 g 11  . levocetirizine (XYZAL) 5 MG tablet Take 1 tablet (5 mg total) by mouth every evening. 90 tablet 1  . lisinopril (ZESTRIL) 10 MG tablet TAKE 1 TABLET BY MOUTH EVERY DAY 90 tablet 3  . meloxicam (MOBIC) 15 MG tablet Take 1 tablet (15 mg total) by mouth daily. 30 tablet 0  . metoprolol succinate (TOPROL-XL) 25 MG 24 hr tablet Take 1 tablet (25 mg total) by mouth daily. 90 tablet 3  . nortriptyline (PAMELOR) 10 MG capsule Take 30 mg by mouth at bedtime.     . rizatriptan (MAXALT) 10 MG tablet Take 10 mg by mouth as needed for migraine. May repeat in 2 hours if needed    . terbinafine (LAMISIL) 250 MG tablet Take 1 tablet (250 mg total) by mouth daily. 30 tablet 2   No current facility-administered medications for this visit.    Patient confirms/reports the following allergies:  Allergies  Allergen Reactions  . Acetaminophen Itching, Swelling and Other (See Comments)    Also notes BURNING  . Shellfish Allergy Itching and Other (See Comments)    Itching of tongue and throat    No orders of the  defined types were placed in this encounter.   AUTHORIZATION INFORMATION Primary Insurance: 1D#: Group #:  Secondary Insurance: 1D#: Group #:  SCHEDULE INFORMATION: Date: 02/06/2019 Time: Location:[facility]

## 2019-01-24 ENCOUNTER — Other Ambulatory Visit: Payer: Self-pay | Admitting: Family Medicine

## 2019-01-24 DIAGNOSIS — M25431 Effusion, right wrist: Secondary | ICD-10-CM

## 2019-01-24 DIAGNOSIS — M25531 Pain in right wrist: Secondary | ICD-10-CM

## 2019-02-04 ENCOUNTER — Telehealth: Payer: Self-pay

## 2019-02-04 ENCOUNTER — Encounter: Payer: Self-pay | Admitting: Cardiology

## 2019-02-04 ENCOUNTER — Other Ambulatory Visit: Payer: Self-pay

## 2019-02-04 ENCOUNTER — Ambulatory Visit: Payer: Managed Care, Other (non HMO)

## 2019-02-04 ENCOUNTER — Other Ambulatory Visit: Payer: Managed Care, Other (non HMO)

## 2019-02-04 ENCOUNTER — Ambulatory Visit (INDEPENDENT_AMBULATORY_CARE_PROVIDER_SITE_OTHER): Payer: Managed Care, Other (non HMO) | Admitting: Cardiology

## 2019-02-04 VITALS — BP 124/92 | HR 96 | Ht 66.0 in | Wt 188.2 lb

## 2019-02-04 DIAGNOSIS — R002 Palpitations: Secondary | ICD-10-CM | POA: Diagnosis not present

## 2019-02-04 NOTE — Progress Notes (Signed)
Cardiology Office Note:    Date:  02/04/2019   ID:  Shirley Harris, DOB 1970/05/10, MRN [id number]  PCP:  Hubbard Hartshorn, FNP  Cardiologist:  Kate Sable, MD  Electrophysiologist:  None   Referring MD: Hubbard Hartshorn, FNP   Chief Complaint  Patient presents with  . other    Pt palps rapid heart beat at night. Meds verbally reviewed w/ pt.   Shirley Harris is a 48 y.o. female who is being seen today for the evaluation of palpitations at the request of Hubbard Hartshorn, FNP.   History of Present Illness:    Shirley Harris is a 48 y.o. female with a hx of hypertension, migraines who presents due to palpitations.  Patient states having history of increased heart rates in the past but did not really pay attention to it on to 4 weeks ago.  She woke up from sleep with elevated heart rates and palpitations.  Her husband noticed her pulse was high and manually checked it and had about 160 bpm on calculation.  Symptoms went on for couple of minutes, she took deep breaths which helps somewhat, and then she went to sleep.  Symptoms have occured about 1 to 2 days/week during the subsequent 2 weeks.  She denies any history of heart disease.  Denies ever smoking.  Symptoms typically occur while patient is asleep.  She performs all her activities of daily living with no symptoms or restrictions.  She denies dizziness, shortness of breath, chest pain, presyncope or syncope.  Past Medical History:  Diagnosis Date  . Hypertension     Past Surgical History:  Procedure Laterality Date  . BREAST BIOPSY Right 2015   Benign breast tissue with cystic and papillary apically metaplasia  . CESAREAN SECTION    . LAPAROSCOPIC VAGINAL HYSTERECTOMY WITH SALPINGECTOMY Bilateral 01/29/2018   Procedure: LAPAROSCOPIC ASSISTED VAGINAL HYSTERECTOMY WITH BILATERAL SALPINGECTOMY;  Surgeon: Harlin Heys, MD;  Location: [facility] ORS;  Service: Gynecology;  Laterality: Bilateral;  . TUBAL LIGATION  2010    Current  Medications: Current Meds  Medication Sig  . fluticasone (FLONASE) 50 MCG/ACT nasal spray Place 2 sprays into both nostrils daily.  Marland Kitchen levocetirizine (XYZAL) 5 MG tablet Take 1 tablet (5 mg total) by mouth every evening.  Marland Kitchen lisinopril (ZESTRIL) 10 MG tablet TAKE 1 TABLET BY MOUTH EVERY DAY  . meloxicam (MOBIC) 15 MG tablet Take 1 tablet (15 mg total) by mouth daily.  . metoprolol succinate (TOPROL-XL) 25 MG 24 hr tablet Take 1 tablet (25 mg total) by mouth daily.  . nortriptyline (PAMELOR) 10 MG capsule Take 30 mg by mouth at bedtime.   . rizatriptan (MAXALT) 10 MG tablet Take 10 mg by mouth as needed for migraine. May repeat in 2 hours if needed  . terbinafine (LAMISIL) 250 MG tablet Take 1 tablet (250 mg total) by mouth daily.     Allergies:   Acetaminophen and Shellfish allergy   Social History   Socioeconomic History  . Marital status: Married    Spouse name: [name]   . Number of children: 3  . Years of education: Not on file  . Highest education level: Not on file  Occupational History  . Not on file  Tobacco Use  . Smoking status: Never Smoker  . Smokeless tobacco: Never Used  Substance and Sexual Activity  . Alcohol use: Not Currently  . Drug use: Never  . Sexual activity: Yes    Partners: Male    Birth control/protection: Surgical  Other Topics Concern  . Not on file  Social History Narrative  . Not on file   Social Determinants of Health   Financial Resource Strain:   . Difficulty of Paying Living Expenses: Not on file  Food Insecurity:   . Worried About Charity fundraiser in the Last Year: Not on file  . Ran Out of Food in the Last Year: Not on file  Transportation Needs:   . Lack of Transportation (Medical): Not on file  . Lack of Transportation (Non-Medical): Not on file  Physical Activity:   . Days of Exercise per Week: Not on file  . Minutes of Exercise per Session: Not on file  Stress:   . Feeling of Stress : Not on file  Social Connections:  Unknown  . Frequency of Communication with Friends and Family: Not on file  . Frequency of Social Gatherings with Friends and Family: Never  . Attends Religious Services: Not on file  . Active Member of Clubs or Organizations: Not on file  . Attends Archivist Meetings: Not on file  . Marital Status: Not on file     Family History: The patient's family history includes Heart attack in her maternal grandfather; Multiple sclerosis in her father; Stroke in her mother. There is no history of Breast cancer.  ROS:   Please see the history of present illness.     All other systems reviewed and are negative.  EKGs/Labs/Other Studies Reviewed:    The following studies were reviewed today:   EKG:  EKG is  ordered today.  The ekg ordered today demonstrates normal sinus rhythm, normal ECG.  Recent Labs: No results found for requested labs within last 8760 hours.  Recent Lipid Panel    Component Value Date/Time   CHOL 212 (H) 01/18/2018 0839   TRIG 67 01/18/2018 0839   HDL 74 01/18/2018 0839   CHOLHDL 2.9 01/18/2018 0839   LDLCALC 125 (H) 01/18/2018 0839    Physical Exam:    VS:  BP (!) 124/92 (BP Location: Right Arm, Patient Position: Sitting, Cuff Size: Normal)   Pulse 96   Ht 5\' 6"  (1.676 m)   Wt 188 lb 3.2 oz (85.4 kg)   LMP 12/05/2017 (Exact Date)   SpO2 98%   BMI 30.38 kg/m     Wt Readings from Last 3 Encounters:  02/04/19 188 lb 3.2 oz (85.4 kg)  01/08/19 188 lb 3.2 oz (85.4 kg)  12/25/18 186 lb 6.4 oz (84.6 kg)     GEN:  Well nourished, well developed in no acute distress HEENT: Normal NECK: No JVD; No carotid bruits LYMPHATICS: No lymphadenopathy CARDIAC: RRR, no murmurs, rubs, gallops RESPIRATORY:  Clear to auscultation without rales, wheezing or rhonchi  ABDOMEN: Soft, non-tender, non-distended MUSCULOSKELETAL:  No edema; No deformity  SKIN: Warm and dry NEUROLOGIC:  Alert and oriented x 3 PSYCHIATRIC:  Normal affect   ASSESSMENT:   Symptoms  could be secondary to a supraventricular tachycardia or not think. 1. Palpitations    PLAN:    In order of problems listed above:  1. We will place a cardiac monitor x2 weeks.  Reasonable to hold off on beta-blocker for now.  Further recommendations pending findings on monitor.  Follow-up in a month.  This note was generated in part or whole with voice recognition software. Voice recognition is usually quite accurate but there are transcription errors that can and very often do occur. I apologize for any typographical errors that were not detected  and corrected.  Medication Adjustments/Labs and Tests Ordered: Current medicines are reviewed at length with the patient today.  Concerns regarding medicines are outlined above.  Orders Placed This Encounter  Procedures  . LONG TERM MONITOR (3-14 DAYS)  . EKG 12-Lead   No orders of the defined types were placed in this encounter.   Patient Instructions  Medication Instructions:  No change in medications. *If you need a refill on your cardiac medications before your next appointment, please call your pharmacy*  Lab Work: No lab work.   If you have labs (blood work) drawn today and your tests are completely normal, you will receive your results only by: Marland Kitchen MyChart Message (if you have MyChart) OR . A paper copy in the mail If you have any lab test that is abnormal or we need to change your treatment, we will call you to review the results.  Testing/Procedures: Your physician has recommended that you wear an Zio monitor. Zio monitors are medical devices that record the heart's electrical activity. Doctors most often [place] these monitors to diagnose arrhythmias. Arrhythmias are problems with the speed or rhythm of the heartbeat. The monitor is a small, portable device. You can wear one while you do your normal daily activities. This is usually used to diagnose what is causing palpitations/syncope (passing out).    Follow-Up: At [facility], you and your health needs are our priority.  As part of our continuing mission to provide you with exceptional heart care, we have created designated Provider Care Teams.  These Care Teams include your primary Cardiologist (physician) and Advanced Practice Providers (APPs -  Physician Assistants and Nurse Practitioners) who all work together to provide you with the care you need, when you need it.  Your next appointment:   1 month(s)  The format for your next appointment:   In Person  Provider:    You may see Kate Sable, MD or one of the following Advanced Practice Providers on your designated Care Team:    Murray Hodgkins, NP  Christell Faith, PA-C  Marrianne Mood, PA-C   Other Instructions      Signed, Kate Sable, MD  02/04/2019 5:08 PM    [place]

## 2019-02-04 NOTE — Telephone Encounter (Signed)
Called and left a message for call back. Does not see that patient went for COVID test today.

## 2019-02-04 NOTE — Patient Instructions (Signed)
Medication Instructions:  No change in medications. *If you need a refill on your cardiac medications before your next appointment, please call your pharmacy*  Lab Work: No lab work.   If you have labs (blood work) drawn today and your tests are completely normal, you will receive your results only by: Marland Kitchen MyChart Message (if you have MyChart) OR . A paper copy in the mail If you have any lab test that is abnormal or we need to change your treatment, we will call you to review the results.  Testing/Procedures: Your physician has recommended that you wear an Zio monitor. Zio monitors are medical devices that record the heart's electrical activity. Doctors most often [place] these monitors to diagnose arrhythmias. Arrhythmias are problems with the speed or rhythm of the heartbeat. The monitor is a small, portable device. You can wear one while you do your normal daily activities. This is usually used to diagnose what is causing palpitations/syncope (passing out).    Follow-Up: At [facility], you and your health needs are our priority.  As part of our continuing mission to provide you with exceptional heart care, we have created designated Provider Care Teams.  These Care Teams include your primary Cardiologist (physician) and Advanced Practice Providers (APPs -  Physician Assistants and Nurse Practitioners) who all work together to provide you with the care you need, when you need it.  Your next appointment:   1 month(s)  The format for your next appointment:   In Person  Provider:    You may see Kate Sable, MD or one of the following Advanced Practice Providers on your designated Care Team:    Murray Hodgkins, NP  Christell Faith, PA-C  Marrianne Mood, PA-C   Other Instructions

## 2019-02-05 ENCOUNTER — Telehealth: Payer: Self-pay | Admitting: Cardiology

## 2019-02-05 NOTE — Telephone Encounter (Signed)
Attempted to schedule 1 m fu zio no ans no vm .  Sent msg to [employer] .  Holding 03/11/19 at 4 with [employer].

## 2019-02-07 LAB — COMPREHENSIVE METABOLIC PANEL
ALT: 17 IU/L (ref 0–32)
AST: 15 IU/L (ref 0–40)
Albumin/Globulin Ratio: 1.4 (ref 1.2–2.2)
Albumin: 4.1 g/dL (ref 3.8–4.8)
Alkaline Phosphatase: 86 IU/L (ref 39–117)
BUN/Creatinine Ratio: 10 (ref 9–23)
BUN: 7 mg/dL (ref 6–24)
Bilirubin Total: 0.3 mg/dL (ref 0.0–1.2)
CO2: 19 mmol/L — ABNORMAL LOW (ref 20–29)
Calcium: 9.4 mg/dL (ref 8.7–10.2)
Chloride: 105 mmol/L (ref 96–106)
Creatinine, Ser: 0.7 mg/dL (ref 0.57–1.00)
GFR calc Af Amer: 120 mL/min/{1.73_m2} (ref >59)
GFR calc non Af Amer: 104 mL/min/{1.73_m2} (ref >59)
Globulin, Total: 2.9 g/dL (ref 1.5–4.5)
Glucose: 106 mg/dL — ABNORMAL HIGH (ref 65–99)
Potassium: 4 mmol/L (ref 3.5–5.2)
Sodium: 137 mmol/L (ref 134–144)
Total Protein: 7 g/dL (ref 6.0–8.5)

## 2019-02-07 LAB — CBC WITH DIFFERENTIAL/PLATELET
Basophils Absolute: 0.1 10*3/uL (ref 0.0–0.2)
Basos: 1 %
EOS (ABSOLUTE): 0.2 10*3/uL (ref 0.0–0.4)
Eos: 2 %
Hematocrit: 42.2 % (ref 34.0–46.6)
Hemoglobin: 13.7 g/dL (ref 11.1–15.9)
Immature Grans (Abs): 0 10*3/uL (ref 0.0–0.1)
Immature Granulocytes: 0 %
Lymphocytes Absolute: 2.2 10*3/uL (ref 0.7–3.1)
Lymphs: 24 %
MCH: 27.8 pg (ref 26.6–33.0)
MCHC: 32.5 g/dL (ref 31.5–35.7)
MCV: 86 fL (ref 79–97)
Monocytes Absolute: 0.8 10*3/uL (ref 0.1–0.9)
Monocytes: 9 %
Neutrophils Absolute: 6 10*3/uL (ref 1.4–7.0)
Neutrophils: 64 %
Platelets: 377 10*3/uL (ref 150–450)
RBC: 4.93 x10E6/uL (ref 3.77–5.28)
RDW: 12.3 % (ref 11.7–15.4)
WBC: 9.3 10*3/uL (ref 3.4–10.8)

## 2019-02-07 LAB — LIPID PANEL
Chol/HDL Ratio: 2.9 ratio (ref 0.0–4.4)
Cholesterol, Total: 195 mg/dL (ref 100–199)
HDL: 67 mg/dL (ref >39)
LDL Chol Calc (NIH): 99 mg/dL (ref 0–99)
Triglycerides: 171 mg/dL — ABNORMAL HIGH (ref 0–149)
VLDL Cholesterol Cal: 29 mg/dL (ref 5–40)

## 2019-02-07 LAB — HEPATITIS C ANTIBODY: Hep C Virus Ab: <0.1 s/co ratio (ref 0.0–0.9)

## 2019-02-07 LAB — HIV ANTIBODY (ROUTINE TESTING W REFLEX): HIV Screen 4th Generation wRfx: NONREACTIVE

## 2019-02-07 LAB — TSH: TSH: 1.12 u[IU]/mL (ref 0.450–4.500)

## 2019-02-19 ENCOUNTER — Other Ambulatory Visit
Admission: RE | Admit: 2019-02-19 | Discharge: 2019-02-19 | Disposition: A | Discharge status: Home / Self Care | Payer: Managed Care, Other (non HMO) | Source: Ambulatory Visit | Attending: Gastroenterology | Admitting: Gastroenterology

## 2019-02-19 ENCOUNTER — Other Ambulatory Visit: Payer: Self-pay | Admitting: Obstetrics and Gynecology

## 2019-02-19 DIAGNOSIS — Z20822 Contact with and (suspected) exposure to covid-19: Secondary | ICD-10-CM | POA: Diagnosis not present

## 2019-02-19 DIAGNOSIS — Z01812 Encounter for preprocedural laboratory examination: Secondary | ICD-10-CM | POA: Insufficient documentation

## 2019-02-19 DIAGNOSIS — L900 Lichen sclerosus et atrophicus: Secondary | ICD-10-CM

## 2019-02-19 LAB — SARS CORONAVIRUS 2 (TAT 6-24 HRS): SARS Coronavirus 2: NEGATIVE

## 2019-02-20 ENCOUNTER — Other Ambulatory Visit: Payer: Self-pay

## 2019-02-21 ENCOUNTER — Ambulatory Visit: Payer: Managed Care, Other (non HMO) | Admitting: Anesthesiology

## 2019-02-21 ENCOUNTER — Encounter: Payer: Self-pay | Admitting: Gastroenterology

## 2019-02-21 ENCOUNTER — Ambulatory Visit
Admission: RE | Admit: 2019-02-21 | Discharge: 2019-02-21 | Disposition: A | Discharge status: Home / Self Care | Payer: Managed Care, Other (non HMO) | Attending: Gastroenterology | Admitting: Gastroenterology

## 2019-02-21 ENCOUNTER — Encounter
Admission: RE | Disposition: A | Discharge status: Home / Self Care | Payer: Self-pay | Source: Home / Self Care | Attending: Gastroenterology

## 2019-02-21 DIAGNOSIS — Z79899 Other long term (current) drug therapy: Secondary | ICD-10-CM | POA: Insufficient documentation

## 2019-02-21 DIAGNOSIS — Z791 Long term (current) use of non-steroidal anti-inflammatories (NSAID): Secondary | ICD-10-CM | POA: Diagnosis not present

## 2019-02-21 DIAGNOSIS — K635 Polyp of colon: Secondary | ICD-10-CM | POA: Diagnosis not present

## 2019-02-21 DIAGNOSIS — K644 Residual hemorrhoidal skin tags: Secondary | ICD-10-CM | POA: Diagnosis not present

## 2019-02-21 DIAGNOSIS — I10 Essential (primary) hypertension: Secondary | ICD-10-CM | POA: Insufficient documentation

## 2019-02-21 DIAGNOSIS — Z1211 Encounter for screening for malignant neoplasm of colon: Secondary | ICD-10-CM

## 2019-02-21 HISTORY — PX: COLONOSCOPY WITH PROPOFOL: SHX5780

## 2019-02-21 SURGERY — COLONOSCOPY WITH PROPOFOL
Anesthesia: General

## 2019-02-21 MED ORDER — PROPOFOL 500 MG/50ML IV EMUL
INTRAVENOUS | Status: DC | PRN
  Administered 2019-02-21: 165 ug/kg/min via INTRAVENOUS

## 2019-02-21 MED ORDER — LIDOCAINE HCL (CARDIAC) PF 100 MG/5ML IV SOSY
PREFILLED_SYRINGE | INTRAVENOUS | Status: DC | PRN
  Administered 2019-02-21: 100 mg via INTRATRACHEAL

## 2019-02-21 MED ORDER — SODIUM CHLORIDE 0.9 % IV SOLN: INTRAVENOUS | Status: DC

## 2019-02-21 MED ORDER — PROPOFOL 10 MG/ML IV BOLUS
INTRAVENOUS | Status: DC | PRN
  Administered 2019-02-21: 10 mg via INTRAVENOUS
  Administered 2019-02-21: 60 mg via INTRAVENOUS
  Administered 2019-02-21 (×2): 10 mg via INTRAVENOUS

## 2019-02-21 NOTE — Anesthesia Preprocedure Evaluation (Signed)
Anesthesia Evaluation  Patient identified by MRN, date of birth, ID band Patient awake    Reviewed: Allergy & Precautions, H&P , NPO status , Patient's Chart, lab work & pertinent test results, reviewed documented beta blocker date and time   Airway Mallampati: II  TM Distance: >3 FB Neck ROM: full    Dental  (+) Teeth Intact   Pulmonary neg pulmonary ROS,    Pulmonary exam normal        Cardiovascular Exercise Tolerance: Good hypertension, negative cardio ROS Normal cardiovascular exam Rhythm:regular Rate:Normal     Neuro/Psych  Headaches, negative neurological ROS  negative psych ROS   GI/Hepatic negative GI ROS, Neg liver ROS,   Endo/Other  negative endocrine ROS  Renal/GU negative Renal ROS  Female GU complaint     Musculoskeletal negative musculoskeletal ROS (+)   Abdominal   Peds negative pediatric ROS (+)  Hematology negative hematology ROS (+)   Anesthesia Other Findings History reviewed. No pertinent past medical history. Past Surgical History: 2015: BREAST BIOPSY; Right     Comment:  Benign breast tissue with cystic and papillary apically               metaplasia No date: CESAREAN SECTION 2010: TUBAL LIGATION BMI    Body Mass Index:  27.39 kg/m     Reproductive/Obstetrics negative OB ROS                             Anesthesia Physical  Anesthesia Plan  ASA: II  Anesthesia Plan:    Post-op Pain Management:    Induction: Intravenous  PONV Risk Score and Plan: TIVA  Airway Management Planned:   Additional Equipment:   Intra-op Plan:   Post-operative Plan:   Informed Consent: I have reviewed the patients History and Physical, chart, labs and discussed the procedure including the risks, benefits and alternatives for the proposed anesthesia with the patient or authorized representative who has indicated his/her understanding and acceptance.     Dental  Advisory Given  Plan Discussed with: CRNA  Anesthesia Plan Comments:         Anesthesia Quick Evaluation

## 2019-02-21 NOTE — Anesthesia Postprocedure Evaluation (Signed)
Anesthesia Post Note  Patient: Shirley Harris  Procedure(s) Performed: COLONOSCOPY WITH PROPOFOL (N/A )  Patient location during evaluation: Endoscopy Anesthesia Type: General Level of consciousness: awake and alert and oriented Pain management: pain level controlled Vital Signs Assessment: post-procedure vital signs reviewed and stable Respiratory status: spontaneous breathing Cardiovascular status: blood pressure returned to baseline Anesthetic complications: no     Last Vitals:  Vitals:   02/21/19 1007 02/21/19 1017  BP: 105/86 (!) 122/95  Pulse: 97   Resp:    Temp:    SpO2: 100%     Last Pain:  Vitals:   02/21/19 1027  TempSrc:   PainSc: 0-No pain                 Tian Davison

## 2019-02-21 NOTE — Op Note (Addendum)
[facility] Gastroenterology Patient Name: Shirley Harris Procedure Date: 02/21/2019 9:00 AM MRN: [id number] Account #: [id number] Date of Birth: April 21, 1970 Admit Type: Outpatient Age: 49 Room: [facility] ENDO ROOM 4 Gender: Female Note Status: Finalized Procedure:             Colonoscopy Indications:           Screening for colorectal malignant neoplasm, This is                         the patient's first colonoscopy Providers:             Lin Landsman MD, Referring MD:          No Local Md, MD (Referring MD) Medicines:             Monitored Anesthesia Care Complications:         No immediate complications. Estimated blood loss: None. Procedure:             Pre-Anesthesia Assessment:                        - Prior to the procedure, a History and Physical was                         performed, and patient medications and allergies were                         reviewed. The patient is competent. The risks and                         benefits of the procedure and the sedation options and                         risks were discussed with the patient. All questions                         were answered and informed consent was obtained.                         Patient identification and proposed procedure were                         verified by the physician, the nurse, the                         anesthesiologist, the anesthetist and the technician                         in the pre-procedure area in the procedure room in the                         endoscopy suite. Mental Status Examination: alert and                         oriented. Airway Examination: normal oropharyngeal                         airway and neck mobility. Respiratory Examination:  clear to auscultation. CV Examination: normal.                         Prophylactic Antibiotics: The patient does not require                         prophylactic antibiotics. Prior Anticoagulants:  The                         patient has taken no previous anticoagulant or                         antiplatelet agents. ASA Grade Assessment: II - A                         patient with mild systemic disease. After reviewing                         the risks and benefits, the patient was deemed in                         satisfactory condition to undergo the procedure. The                         anesthesia plan was to use monitored anesthesia care                         (MAC). Immediately prior to administration of                         medications, the patient was re-assessed for adequacy                         to receive sedatives. The heart rate, respiratory                         rate, oxygen saturations, blood pressure, adequacy of                         pulmonary ventilation, and response to care were                         monitored throughout the procedure. The physical                         status of the patient was re-assessed after the                         procedure.                        After obtaining informed consent, the colonoscope was                         passed under direct vision. Throughout the procedure,                         the patient's blood pressure, pulse, and oxygen  saturations were monitored continuously. The                         Colonoscope was introduced through the anus and                         advanced to the the cecum, identified by appendiceal                         orifice and ileocecal valve. The colonoscopy was                         performed without difficulty. The patient tolerated                         the procedure well. The quality of the bowel                         preparation was evaluated using the BBPS [facility] Bowel                         Preparation Scale) with scores of: Right Colon = 3,                         Transverse Colon = 3 and Left Colon = 3 (entire mucosa                          seen well with no residual staining, small fragments                         of stool or opaque liquid). The total BBPS score                         equals 9. Findings:      A diminutive polyp was found in the descending colon. The polyp was       sessile. The polyp was removed with a cold biopsy forceps. Resection and       retrieval were complete.      Non-bleeding external hemorrhoids were found during retroflexion. The       hemorrhoids were medium-sized. Impression:            - One diminutive polyp in the descending colon,                         removed with a cold biopsy forceps. Resected and                         retrieved.                        - Non-bleeding external hemorrhoids. Recommendation:        - Discharge patient to home (with escort).                        - Resume previous diet today.                        - Continue present medications.                        -  Await pathology results.                        - Repeat colonoscopy in 7-10 years for surveillance. Procedure Code(s):     --- Professional ---                        [phone], Colonoscopy, flexible; with biopsy, single or                         multiple Diagnosis Code(s):     --- Professional ---                        Z12.11, Encounter for screening for malignant neoplasm                         of colon                        K63.5, Polyp of colon                        K64.4, Residual hemorrhoidal skin tags CPT copyright 2019 American Medical Association. All rights reserved. The codes documented in this report are preliminary and upon coder review may  be revised to meet current compliance requirements. Dr. Ulyess Mort Lin Landsman MD, MD 02/21/2019 9:58:36 AM This report has been signed electronically. Number of Addenda: 0 Note Initiated On: 02/21/2019 9:00 AM Scope Withdrawal Time: 0 hours 10 minutes 49 seconds  Total Procedure Duration: 0 hours 18 minutes 35 seconds  Estimated  Blood Loss:  Estimated blood loss: none.      [facility]

## 2019-02-21 NOTE — H&P (Signed)
Shirley Darby, MD [address]  [place]  [place],  [address]  Main: [phone]  Fax: [phone] Pager: [phone]  Primary Care Physician:  Hubbard Hartshorn, FNP Primary Gastroenterologist:  Dr. Cephas Harris  Pre-Procedure History & Physical: HPI:  Shirley Harris is a 49 y.o. female is here for an colonoscopy.   Past Medical History:  Diagnosis Date  . Hypertension     Past Surgical History:  Procedure Laterality Date  . BREAST BIOPSY Right 2015   Benign breast tissue with cystic and papillary apically metaplasia  . CESAREAN SECTION    . LAPAROSCOPIC VAGINAL HYSTERECTOMY WITH SALPINGECTOMY Bilateral 01/29/2018   Procedure: LAPAROSCOPIC ASSISTED VAGINAL HYSTERECTOMY WITH BILATERAL SALPINGECTOMY;  Surgeon: Harlin Heys, MD;  Location: [facility] ORS;  Service: Gynecology;  Laterality: Bilateral;  . TUBAL LIGATION  2010    Prior to Admission medications   Medication Sig Start Date End Date Taking? Authorizing Provider  clobetasol cream (TEMOVATE) [id number] % APPLY 1 APPLICATION TOPICALLY 2 (TWO) TIMES DAILY FOR 30 DAYS THEN USE DAILY AS DIRECTED. 02/19/19  Yes Harlin Heys, MD  fluticasone [facility]) 50 MCG/ACT nasal spray Place 2 sprays into both nostrils daily. Patient not taking: Reported on 02/21/2019 06/18/18   Hubbard Hartshorn, FNP  levocetirizine (XYZAL) 5 MG tablet Take 1 tablet (5 mg total) by mouth every evening. 06/18/18   Hubbard Hartshorn, FNP  lisinopril (ZESTRIL) 10 MG tablet TAKE 1 TABLET BY MOUTH EVERY DAY 07/21/18   Hubbard Hartshorn, FNP  meloxicam (MOBIC) 15 MG tablet Take 1 tablet (15 mg total) by mouth daily. 12/25/18   Delsa Grana, PA-C  metoprolol succinate (TOPROL-XL) 25 MG 24 hr tablet Take 1 tablet (25 mg total) by mouth daily. 01/08/19   Hubbard Hartshorn, FNP  nortriptyline (PAMELOR) 10 MG capsule Take 30 mg by mouth at bedtime.  11/30/16   [provider]  rizatriptan (MAXALT) 10 MG tablet Take 10 mg by mouth as needed for migraine. May  repeat in 2 hours if needed    [provider]  terbinafine (LAMISIL) 250 MG tablet Take 1 tablet (250 mg total) by mouth daily. 11/30/18   Edrick Kins, DPM    Allergies as of 01/21/2019 - Review Complete 01/08/2019  Allergen Reaction Noted  . Acetaminophen Itching, Swelling, and Other (See Comments) 11/30/2016  . Shellfish allergy Itching and Other (See Comments) 11/16/2017    Family History  Problem Relation Age of Onset  . Stroke Mother   . Multiple sclerosis Father   . Heart attack Maternal Grandfather   . Breast cancer Neg Hx     Social History   Socioeconomic History  . Marital status: Married    Spouse name: [name]   . Number of children: 3  . Years of education: Not on file  . Highest education level: Not on file  Occupational History  . Not on file  Tobacco Use  . Smoking status: Never Smoker  . Smokeless tobacco: Never Used  Substance and Sexual Activity  . Alcohol use: Not Currently  . Drug use: Never  . Sexual activity: Yes    Partners: Male    Birth control/protection: Surgical  Other Topics Concern  . Not on file  Social History Narrative  . Not on file   Social Determinants of Health   Financial Resource Strain:   . Difficulty of Paying Living Expenses: Not on file  Food Insecurity:   . Worried About Charity fundraiser in  the Last Year: Not on file  . Ran Out of Food in the Last Year: Not on file  Transportation Needs:   . Lack of Transportation (Medical): Not on file  . Lack of Transportation (Non-Medical): Not on file  Physical Activity:   . Days of Exercise per Week: Not on file  . Minutes of Exercise per Session: Not on file  Stress:   . Feeling of Stress : Not on file  Social Connections: Unknown  . Frequency of Communication with Friends and Family: Not on file  . Frequency of Social Gatherings with Friends and Family: Never  . Attends Religious Services: Not on file  . Active Member of Clubs or Organizations: Not on file   . Attends Archivist Meetings: Not on file  . Marital Status: Not on file  Intimate Partner Violence:   . Fear of Current or Ex-Partner: Not on file  . Emotionally Abused: Not on file  . Physically Abused: Not on file  . Sexually Abused: Not on file    Review of Systems: See HPI, otherwise negative ROS  Physical Exam: BP 125/73   Pulse (!) 112   Temp 97.6 F (36.4 C) (Temporal)   Resp 20   Ht 5\' 6"  (1.676 m)   Wt 84.4 kg   LMP 12/05/2017 (Exact Date)   SpO2 99%   BMI 30.02 kg/m  General:   Alert,  pleasant and cooperative in NAD Head:  Normocephalic and atraumatic. Neck:  Supple; no masses or thyromegaly. Lungs:  Clear throughout to auscultation.    Heart:  Regular rate and rhythm. Abdomen:  Soft, nontender and nondistended. Normal bowel sounds, without guarding, and without rebound.   Neurologic:  Alert and  oriented x4;  grossly normal neurologically.  Impression/Plan: Shirley Harris is here for an colonoscopy to be performed for colon cancer screening  Risks, benefits, limitations, and alternatives regarding  colonoscopy have been reviewed with the patient.  Questions have been answered.  All parties agreeable.   Sherri Sear, MD  02/21/2019, 10:01 AM

## 2019-02-21 NOTE — Transfer of Care (Signed)
Immediate Anesthesia Transfer of Care Note  Patient: Shirley Harris  Procedure(s) Performed: COLONOSCOPY WITH PROPOFOL (N/A )  Patient Location: Endoscopy Unit  Anesthesia Type:General  Level of Consciousness: awake, drowsy and patient cooperative  Airway & Oxygen Therapy: Patient Spontanous Breathing and Patient connected to face mask oxygen  Post-op Assessment: Report given to RN and Post -op Vital signs reviewed and stable  Post vital signs: Reviewed and stable  Last Vitals:  Vitals Value Taken Time  BP 125/73 02/21/19 0958  Temp    Pulse 102 02/21/19 1000  Resp 19 02/21/19 1000  SpO2 100 % 02/21/19 1000  Vitals shown include unvalidated device data.  Last Pain:  Vitals:   02/21/19 0957  TempSrc: Temporal  PainSc:          Complications: No apparent anesthesia complications

## 2019-02-22 ENCOUNTER — Encounter: Payer: Self-pay | Admitting: Gastroenterology

## 2019-02-22 LAB — SURGICAL PATHOLOGY

## 2019-03-18 ENCOUNTER — Ambulatory Visit (INDEPENDENT_AMBULATORY_CARE_PROVIDER_SITE_OTHER): Payer: Managed Care, Other (non HMO) | Admitting: Cardiology

## 2019-03-18 ENCOUNTER — Other Ambulatory Visit: Payer: Self-pay

## 2019-03-18 ENCOUNTER — Encounter: Payer: Self-pay | Admitting: Cardiology

## 2019-03-18 VITALS — BP 135/88 | HR 93 | Ht 66.0 in | Wt 190.0 lb

## 2019-03-18 DIAGNOSIS — R002 Palpitations: Secondary | ICD-10-CM | POA: Diagnosis not present

## 2019-03-18 DIAGNOSIS — I10 Essential (primary) hypertension: Secondary | ICD-10-CM | POA: Diagnosis not present

## 2019-03-18 NOTE — Progress Notes (Signed)
Cardiology Office Note:    Date:  03/18/2019   ID:  Shirley Harris, DOB September 01, 1970, MRN [id number]  PCP:  Hubbard Hartshorn, FNP  Cardiologist:  Kate Sable, MD  Electrophysiologist:  None   Referring MD: Hubbard Hartshorn, FNP   Chief Complaint  Patient presents with  . other    F/u zio. Meds reviewed verbally with pt.    History of Present Illness:    Shirley Harris is a 49 y.o. female with a hx of hypertension, migraines who presents for follow-up.  She was last seen due to palpitations.    She had a history of increased heart rates with manual calculation of 160bpm lasting a couple of minutes.  Denies any other symptoms of dizziness, shortness of breath, chest pain, presyncope or syncope.  Cardiac monitor was placed to evaluate for arrhythmias.    Past Medical History:  Diagnosis Date  . Hypertension     Past Surgical History:  Procedure Laterality Date  . BREAST BIOPSY Right 2015   Benign breast tissue with cystic and papillary apically metaplasia  . CESAREAN SECTION    . COLONOSCOPY WITH PROPOFOL N/A 02/21/2019   Procedure: COLONOSCOPY WITH PROPOFOL;  Surgeon: Lin Landsman, MD;  Location: [facility] ENDOSCOPY;  Service: Gastroenterology;  Laterality: N/A;  . LAPAROSCOPIC VAGINAL HYSTERECTOMY WITH SALPINGECTOMY Bilateral 01/29/2018   Procedure: LAPAROSCOPIC ASSISTED VAGINAL HYSTERECTOMY WITH BILATERAL SALPINGECTOMY;  Surgeon: Harlin Heys, MD;  Location: [facility] ORS;  Service: Gynecology;  Laterality: Bilateral;  . TUBAL LIGATION  2010    Current Medications: Current Meds  Medication Sig  . clobetasol cream (TEMOVATE) AB-[id number] % APPLY 1 APPLICATION TOPICALLY 2 (TWO) TIMES DAILY FOR 30 DAYS THEN USE DAILY AS DIRECTED.  . fluticasone (FLONASE) 50 MCG/ACT nasal spray Place 2 sprays into both nostrils daily. (Patient taking differently: Place 2 sprays into both nostrils as needed. )  . levocetirizine (XYZAL) 5 MG tablet Take 1 tablet (5 mg total) by mouth every evening.  Marland Kitchen  lisinopril (ZESTRIL) 10 MG tablet TAKE 1 TABLET BY MOUTH EVERY DAY  . meloxicam (MOBIC) 15 MG tablet Take 1 tablet (15 mg total) by mouth daily.  . nortriptyline (PAMELOR) 25 MG capsule Take 25 mg by mouth at bedtime.  . rizatriptan (MAXALT) 10 MG tablet Take 10 mg by mouth as needed for migraine. May repeat in 2 hours if needed  . terbinafine (LAMISIL) 250 MG tablet Take 1 tablet (250 mg total) by mouth daily.     Allergies:   Acetaminophen and Shellfish allergy   Social History   Socioeconomic History  . Marital status: Married    Spouse name: [name]   . Number of children: 3  . Years of education: Not on file  . Highest education level: Not on file  Occupational History  . Not on file  Tobacco Use  . Smoking status: Never Smoker  . Smokeless tobacco: Never Used  Substance and Sexual Activity  . Alcohol use: Not Currently  . Drug use: Never  . Sexual activity: Yes    Partners: Male    Birth control/protection: Surgical  Other Topics Concern  . Not on file  Social History Narrative  . Not on file   Social Determinants of Health   Financial Resource Strain:   . Difficulty of Paying Living Expenses: Not on file  Food Insecurity:   . Worried About Charity fundraiser in the Last Year: Not on file  . Ran Out of Food in the Last Year: Not  on file  Transportation Needs:   . Lack of Transportation (Medical): Not on file  . Lack of Transportation (Non-Medical): Not on file  Physical Activity:   . Days of Exercise per Week: Not on file  . Minutes of Exercise per Session: Not on file  Stress:   . Feeling of Stress : Not on file  Social Connections: Unknown  . Frequency of Communication with Friends and Family: Not on file  . Frequency of Social Gatherings with Friends and Family: Never  . Attends Religious Services: Not on file  . Active Member of Clubs or Organizations: Not on file  . Attends Archivist Meetings: Not on file  . Marital Status: Not on file       Family History: The patient's family history includes Heart attack in her maternal grandfather; Multiple sclerosis in her father; Stroke in her mother. There is no history of Breast cancer.  ROS:   Please see the history of present illness.     All other systems reviewed and are negative.  EKGs/Labs/Other Studies Reviewed:    The following studies were reviewed today: Cardiac monitor, date 03/15/2019 Patient had a min HR of 59 bpm, max HR of 159 bpm, and avg HR of 101 bpm. Predominant underlying rhythm was Sinus Rhythm. Isolated SVEs were rare (<1.0%), SVE Couplets were rare (<1.0%), and no SVE Triplets were present. Isolated VEs were rare (<1.0%), and no VE Couplets or VE Triplets were present.  EKG:  EKG is  ordered today.  The ekg ordered today demonstrates normal sinus rhythm, normal ECG.  Recent Labs: 02/06/2019: ALT 17; BUN 7; Creatinine, Ser 0.70; Hemoglobin 13.7; Platelets 377; Potassium 4.0; Sodium 137; TSH 1.120  Recent Lipid Panel    Component Value Date/Time   CHOL 195 02/06/2019 1522   TRIG 171 (H) 02/06/2019 1522   HDL 67 02/06/2019 1522   CHOLHDL 2.9 02/06/2019 1522   LDLCALC 99 02/06/2019 1522    Physical Exam:    VS:  BP 135/88 (BP Location: Left Arm, Patient Position: Sitting, Cuff Size: Normal)   Pulse 93   Ht 5\' 6"  (1.676 m)   Wt 190 lb (86.2 kg)   LMP 12/05/2017 (Exact Date)   SpO2 98%   BMI 30.67 kg/m     Wt Readings from Last 3 Encounters:  03/18/19 190 lb (86.2 kg)  02/21/19 186 lb (84.4 kg)  02/04/19 188 lb 3.2 oz (85.4 kg)     GEN:  Well nourished, well developed in no acute distress HEENT: Normal NECK: No JVD; No carotid bruits LYMPHATICS: No lymphadenopathy CARDIAC: RRR, no murmurs, rubs, gallops RESPIRATORY:  Clear to auscultation without rales, wheezing or rhonchi  ABDOMEN: Soft, non-tender, non-distended MUSCULOSKELETAL:  No edema; No deformity  SKIN: Warm and dry NEUROLOGIC:  Alert and oriented x 3 PSYCHIATRIC:  Normal affect    ASSESSMENT:    1. Palpitations   2. Essential hypertension    PLAN:    In order of problems listed above:  1. Patient with history of palpitations.  Cardiac monitor reviewed and did not show any arrhythmias.  Patient triggered events were associated with sinus rhythm.  Overall benign findings.  Patient reassured. 2. BP is adequately controlled.  Continue lisinopril and Toprol as currently prescribed.  Follow-up as needed  This note was generated in part or whole with voice recognition software. Voice recognition is usually quite accurate but there are transcription errors that can and very often do occur. I apologize for any typographical errors  that were not detected and corrected.  Medication Adjustments/Labs and Tests Ordered: Current medicines are reviewed at length with the patient today.  Concerns regarding medicines are outlined above.  Orders Placed This Encounter  Procedures  . EKG 12-Lead   No orders of the defined types were placed in this encounter.   Patient Instructions  Medication Instructions:  Your physician recommends that you continue on your current medications as directed. Please refer to the Current Medication list given to you today.  *If you need a refill on your cardiac medications before your next appointment, please call your pharmacy*  Lab Work: None ordered If you have labs (blood work) drawn today and your tests are completely normal, you will receive your results only by: Marland Kitchen MyChart Message (if you have MyChart) OR . A paper copy in the mail If you have any lab test that is abnormal or we need to change your treatment, we will call you to review the results.  Testing/Procedures: None ordered  Follow-Up: At [facility], you and your health needs are our priority.  As part of our continuing mission to provide you with exceptional heart care, we have created designated Provider Care Teams.  These Care Teams include your primary Cardiologist  (physician) and Advanced Practice Providers (APPs -  Physician Assistants and Nurse Practitioners) who all work together to provide you with the care you need, when you need it.  Your next appointment:   As needed   The format for your next appointment:   In Person  Provider:    You may see Kate Sable, MD or one of the following Advanced Practice Providers on your designated Care Team:    Murray Hodgkins, NP  Christell Faith, PA-C  Marrianne Mood, PA-C   Other Instructions N/A     Signed, Kate Sable, MD  03/18/2019 12:21 PM    [place]

## 2019-03-18 NOTE — Patient Instructions (Signed)
Medication Instructions:  Your physician recommends that you continue on your current medications as directed. Please refer to the Current Medication list given to you today.  *If you need a refill on your cardiac medications before your next appointment, please call your pharmacy*  Lab Work: None ordered If you have labs (blood work) drawn today and your tests are completely normal, you will receive your results only by: . MyChart Message (if you have MyChart) OR . A paper copy in the mail If you have any lab test that is abnormal or we need to change your treatment, we will call you to review the results.  Testing/Procedures: None ordered  Follow-Up: At [facility], you and your health needs are our priority.  As part of our continuing mission to provide you with exceptional heart care, we have created designated Provider Care Teams.  These Care Teams include your primary Cardiologist (physician) and Advanced Practice Providers (APPs -  Physician Assistants and Nurse Practitioners) who all work together to provide you with the care you need, when you need it.  Your next appointment:   As needed   The format for your next appointment:   In Person  Provider:    You may see Brian Agbor-Etang, MD or one of the following Advanced Practice Providers on your designated Care Team:    Christopher Berge, NP  Ryan Dunn, PA-C  Jacquelyn Visser, PA-C   Other Instructions N/A  

## 2019-06-25 ENCOUNTER — Other Ambulatory Visit: Payer: Self-pay

## 2019-06-25 ENCOUNTER — Encounter: Payer: Self-pay | Admitting: Family Medicine

## 2019-06-25 ENCOUNTER — Ambulatory Visit (INDEPENDENT_AMBULATORY_CARE_PROVIDER_SITE_OTHER): Payer: Managed Care, Other (non HMO) | Admitting: Family Medicine

## 2019-06-25 VITALS — BP 122/78 | HR 95 | Temp 98.3°F | Resp 14 | Ht 66.0 in | Wt 184.5 lb

## 2019-06-25 DIAGNOSIS — H1013 Acute atopic conjunctivitis, bilateral: Secondary | ICD-10-CM

## 2019-06-25 DIAGNOSIS — E663 Overweight: Secondary | ICD-10-CM

## 2019-06-25 DIAGNOSIS — G43909 Migraine, unspecified, not intractable, without status migrainosus: Secondary | ICD-10-CM | POA: Diagnosis not present

## 2019-06-25 DIAGNOSIS — J4520 Mild intermittent asthma, uncomplicated: Secondary | ICD-10-CM

## 2019-06-25 DIAGNOSIS — I10 Essential (primary) hypertension: Secondary | ICD-10-CM | POA: Diagnosis not present

## 2019-06-25 DIAGNOSIS — J302 Other seasonal allergic rhinitis: Secondary | ICD-10-CM | POA: Diagnosis not present

## 2019-06-25 MED ORDER — MONTELUKAST SODIUM 10 MG PO TABS: 10.0000 mg | ORAL_TABLET | Freq: Every day | ORAL | 3 refills | Status: AC

## 2019-06-25 MED ORDER — LISINOPRIL 10 MG PO TABS: 10.0000 mg | ORAL_TABLET | Freq: Every day | ORAL | 3 refills | Status: DC

## 2019-06-25 MED ORDER — ALBUTEROL SULFATE HFA 108 (90 BASE) MCG/ACT IN AERS: 2.0000 | INHALATION_SPRAY | Freq: Four times a day (QID) | RESPIRATORY_TRACT | 2 refills | Status: DC | PRN

## 2019-06-25 MED ORDER — CROMOLYN SODIUM 4 % OP SOLN: 1.0000 [drp] | Freq: Four times a day (QID) | OPHTHALMIC | 2 refills | Status: DC | PRN

## 2019-06-25 MED ORDER — RIZATRIPTAN BENZOATE 10 MG PO TABS: 10.0000 mg | ORAL_TABLET | ORAL | 3 refills | Status: DC | PRN

## 2019-06-25 NOTE — Progress Notes (Signed)
Name: Shirley Harris   MRN: [id number]    DOB: Nov 04, 1970   Date:06/25/2019       Progress Note  Chief Complaint  Patient presents with  . Follow-up  . Medication Refill  . Allergic Rhinitis   . Hypertension     Subjective:   Shirley Harris is a 49 y.o. female, presents to clinic for routine follow up on the conditions listed above.  AR/nasal sx - severe lately with eye sx, nasal sx, post nasal drip and coughing fits/spasms no chest congestion or productive cough She tried Xyzal, has been on Allegra in the past, is using Flonase, symptoms have been much worse over the past 1 to 2 months with spring season.  Hypertension:  Currently managed on lisinopril 10 mg daily Pt reports good med compliance and denies any SE.  No lightheadedness, hypotension, syncope. Blood pressure today is well controlled. BP Readings from Last 3 Encounters:  06/25/19 122/78  03/18/19 135/88  02/21/19 (!) 122/95   Pt denies CP, SOB, exertional sx, LE edema, palpitation, Ha's, visual disturbances Dietary efforts for BP?  Better with diet and exercising  Migraines:   Decreased frequent HA's and she is exercising and paying particular attention to her food triggers - like chocolate and pork she has notices close association with migraines. Nortriptyline 25 mg daily at bedtime - she is not taking and took herself off for months , she is having only occasional HA's and with her lifestyle and diet changes her migraines are well controlled   Maxalt is effective as an abortive     Patient Active Problem List   Diagnosis Date Noted  . Screening for colon cancer   . Fibroids, submucosal 01/29/2018  . Hypertension 12/04/2017  . Overweight (BMI 25.0-29.9) 12/04/2017  . Migraine 12/04/2017  . Insomnia 12/04/2017  . Chronic tension-type headache, not intractable 12/02/2016  . Abnormal mammogram 05/14/2013    Past Surgical History:  Procedure Laterality Date  . BREAST BIOPSY Right 2015   Benign breast  tissue with cystic and papillary apically metaplasia  . CESAREAN SECTION    . COLONOSCOPY WITH PROPOFOL N/A 02/21/2019   Procedure: COLONOSCOPY WITH PROPOFOL;  Surgeon: Lin Landsman, MD;  Location: [facility] ENDOSCOPY;  Service: Gastroenterology;  Laterality: N/A;  . LAPAROSCOPIC VAGINAL HYSTERECTOMY WITH SALPINGECTOMY Bilateral 01/29/2018   Procedure: LAPAROSCOPIC ASSISTED VAGINAL HYSTERECTOMY WITH BILATERAL SALPINGECTOMY;  Surgeon: Harlin Heys, MD;  Location: [facility] ORS;  Service: Gynecology;  Laterality: Bilateral;  . TUBAL LIGATION  2010    Family History  Problem Relation Age of Onset  . Stroke Mother   . Multiple sclerosis Father   . Heart attack Maternal Grandfather   . Breast cancer Neg Hx     Social History   Tobacco Use  . Smoking status: Never Smoker  . Smokeless tobacco: Never Used  Substance Use Topics  . Alcohol use: Not Currently  . Drug use: Never      Current Outpatient Medications:  .  clobetasol cream (TEMOVATE) 6.41 %, APPLY 1 APPLICATION TOPICALLY 2 (TWO) TIMES DAILY FOR 30 DAYS THEN USE DAILY AS DIRECTED., Disp: 60 g, Rfl: 0 .  fluticasone (FLONASE) 50 MCG/ACT nasal spray, Place 2 sprays into both nostrils daily. (Patient taking differently: Place 2 sprays into both nostrils as needed. ), Disp: 16 g, Rfl: 11 .  levocetirizine (XYZAL) 5 MG tablet, Take 1 tablet (5 mg total) by mouth every evening., Disp: 90 tablet, Rfl: 1 .  lisinopril (ZESTRIL) 10 MG tablet, TAKE 1  TABLET BY MOUTH EVERY DAY, Disp: 90 tablet, Rfl: 3 .  rizatriptan (MAXALT) 10 MG tablet, Take 10 mg by mouth as needed for migraine. May repeat in 2 hours if needed, Disp: , Rfl:  .  meloxicam (MOBIC) 15 MG tablet, Take 1 tablet (15 mg total) by mouth daily. (Patient not taking: Reported on 06/25/2019), Disp: 30 tablet, Rfl: 0 .  nortriptyline (PAMELOR) 25 MG capsule, Take 25 mg by mouth at bedtime., Disp: , Rfl:   Allergies  Allergen Reactions  . Acetaminophen Itching, Swelling and Other  (See Comments)    Also notes BURNING  . Shellfish Allergy Itching and Other (See Comments)    Itching of tongue and throat    Chart Review Today: I personally reviewed active problem list, medication list, allergies, family history, social history, health maintenance, notes from last encounter, lab results, imaging with the patient/caregiver today.   Review of Systems  10 Systems reviewed and are negative for acute change except as noted in the HPI.  Objective:    Vitals:   06/25/19 1414  BP: 122/78  Pulse: 95  Resp: 14  Temp: 98.3 F (36.8 C)  SpO2: 98%  Weight: 184 lb 8 oz (83.7 kg)  Height: '5\' 6"'  (1.676 m)    Body mass index is 29.78 kg/m.  Physical Exam Vitals and nursing note reviewed.  Constitutional:      General: She is not in acute distress.    Appearance: Normal appearance. She is well-developed. She is not ill-appearing, toxic-appearing or diaphoretic.  HENT:     Head: Normocephalic and atraumatic.     Nose: Septal deviation and mucosal edema present.     Right Turbinates: Enlarged and swollen.     Left Turbinates: Swollen.  Eyes:     General:        Right eye: No discharge.        Left eye: No discharge.     Conjunctiva/sclera: Conjunctivae normal.  Neck:     Trachea: No tracheal deviation.  Cardiovascular:     Rate and Rhythm: Normal rate and regular rhythm.     Pulses: Normal pulses.     Heart sounds: Normal heart sounds. No murmur. No friction rub. No gallop.   Pulmonary:     Effort: Pulmonary effort is normal. No respiratory distress.     Breath sounds: No stridor.  Musculoskeletal:        General: Normal range of motion.     Right lower leg: No edema.     Left lower leg: No edema.  Skin:    General: Skin is warm and dry.     Findings: No rash.  Neurological:     Mental Status: She is alert.     Motor: No abnormal muscle tone.     Coordination: Coordination normal.  Psychiatric:        Behavior: Behavior normal.        PHQ2/9: Depression screen [facility] 2/9 06/25/2019 01/08/2019 12/25/2018 06/18/2018 12/18/2017  Decreased Interest 0 0 0 0 0  Down, Depressed, Hopeless 0 0 0 0 0  PHQ - 2 Score 0 0 0 0 0  Altered sleeping 0 0 0 0 0  Tired, decreased energy 0 0 0 0 0  Change in appetite 0 0 0 0 0  Feeling bad or failure about yourself  0 0 0 0 0  Trouble concentrating 0 0 0 0 0  Moving slowly or fidgety/restless 0 0 0 0 0  Suicidal thoughts 0 0  0 0 0  PHQ-9 Score 0 0 0 0 0  Difficult doing work/chores Not difficult at all Not difficult at all Not difficult at all Not difficult at all Not difficult at all    phq 9 is neg  Fall Risk: Fall Risk  06/25/2019 01/08/2019 12/25/2018 06/18/2018 12/18/2017  Falls in the past year? 0 0 0 0 0  Number falls in past yr: 0 0 0 0 -  Injury with Fall? 0 0 0 0 -  Follow up - Falls evaluation completed - Falls evaluation completed -    Functional Status Survey: Is the patient deaf or have difficulty hearing?: No Does the patient have difficulty seeing, even when wearing glasses/contacts?: No Does the patient have difficulty concentrating, remembering, or making decisions?: No Does the patient have difficulty walking or climbing stairs?: No Does the patient have difficulty dressing or bathing?: No Does the patient have difficulty doing errands alone such as visiting a doctor's office or shopping?: No   Assessment & Plan:   1. Hypertension, unspecified type Well controlled with lisinopril 10 mg and lifestyle efforts Stable, well controlled today Check kidney function/electrolytes - refills sent in - lisinopril (ZESTRIL) 10 MG tablet; Take 1 tablet (10 mg total) by mouth daily.  Dispense: 90 tablet; Refill: 3 - CMP14+EGFR  2. Migraine without status migrainosus, not intractable, unspecified migraine type Improvement in migraines with starting to exercise and avoiding food triggers also eating in general more healthy, she has stopped the nortriptyline herself and did  not have any withdrawal symptoms or problems, she has only use Maxalt once every few months and it is very effective refill sent in, she was seeing neurology with chronic daily headaches and migraines but she will likely no longer see them and will follow migraines with [place] - rizatriptan (MAXALT) 10 MG tablet; Take 1 tablet (10 mg total) by mouth as needed for migraine. May repeat in 2 hours if needed  Dispense: 10 tablet; Refill: 3  3. Overweight (BMI 25.0-29.9) Weight has been stable she has been working on her diet and exercise efforts  4. Seasonal allergic rhinitis, unspecified trigger Severe seasonal allergies -discussed increasing her over-the-counter second-generation antihistamines even taking 2 a day if needed or having a Claritin decongestant during severe allergy seasons.  We discussed adding Singulair we did discuss black box warning and she is willing to try, she will continue her Flonase. - montelukast (SINGULAIR) 10 MG tablet; Take 1 tablet (10 mg total) by mouth at bedtime.  Dispense: 90 tablet; Refill: 3  5. Allergic conjunctivitis of both eyes Encouraged her to try Pataday over-the-counter or she can get the cromolyn eyedrops if they are covered by her insurance, both would be used separately to treat allergic conjunctivitis symptoms - cromolyn (OPTICROM) 4 % ophthalmic solution; Place 1 drop into both eyes 4 (four) times daily as needed.  Dispense: 10 mL; Refill: 2  6. Mild intermittent reactive airway disease without complication Patient describes coughing spasms and fits that come on all of a sudden are in her upper airway, very difficult to breathe for short periods of time and causes proximal coughing fits.  She does not have otherwise any chest congestion shortness of breath, night sweats, fever, chest pain and she has no productive sputum.  Does sound like some upper airway reactive airway disease/asthma secondary to worsening seasonal allergies encouraged her to try albuterol  inhaler see if it helps with the coughing fits - montelukast (SINGULAIR) 10 MG tablet; Take 1 tablet (10  mg total) by mouth at bedtime.  Dispense: 90 tablet; Refill: 3 - albuterol (VENTOLIN HFA) 108 (90 Base) MCG/ACT inhaler; Inhale 2 puffs into the lungs every 6 (six) hours as needed for wheezing or shortness of breath.  Dispense: 18 g; Refill: 2   Return for Return for CPE after 01/08/2020 - sooner if needed.   Delsa Grana, PA-C 06/25/19 2:26 PM

## 2019-06-25 NOTE — Patient Instructions (Addendum)
Can try pataday generic eye drops over the counter Cromolyn   Can double up on antihistamines during spring - like allegra in am and zytec in pm Some people add a claritin-D daily in their really bothersome seasons.  Add singulair daily at bedtime  If you are really stuffy you can also try sudafed  Really itchy, watery eyes or sneezing, you can add benadryl 12.5-25 mg every 4-6 hours as needed - lowest dose due to the side effects.     Try albuterol inhaler for coughing fits - they sound like a bronchospasm or reactive airway - similar to asthma

## 2019-07-09 ENCOUNTER — Ambulatory Visit: Payer: Managed Care, Other (non HMO) | Admitting: Family Medicine

## 2019-09-03 ENCOUNTER — Other Ambulatory Visit: Payer: Self-pay | Admitting: Family Medicine

## 2019-09-03 ENCOUNTER — Other Ambulatory Visit: Payer: Self-pay | Admitting: Podiatry

## 2019-09-03 DIAGNOSIS — M25531 Pain in right wrist: Secondary | ICD-10-CM

## 2019-09-03 DIAGNOSIS — M25431 Effusion, right wrist: Secondary | ICD-10-CM

## 2019-10-20 ENCOUNTER — Emergency Department
Admission: EM | Admit: 2019-10-20 | Discharge: 2019-10-20 | Disposition: A | Discharge status: Home / Self Care | Payer: BC Managed Care – PPO | Attending: Emergency Medicine | Admitting: Emergency Medicine

## 2019-10-20 ENCOUNTER — Other Ambulatory Visit: Payer: Self-pay

## 2019-10-20 DIAGNOSIS — T63421A Toxic effect of venom of ants, accidental (unintentional), initial encounter: Secondary | ICD-10-CM | POA: Diagnosis not present

## 2019-10-20 DIAGNOSIS — Z79899 Other long term (current) drug therapy: Secondary | ICD-10-CM | POA: Diagnosis not present

## 2019-10-20 DIAGNOSIS — I10 Essential (primary) hypertension: Secondary | ICD-10-CM | POA: Diagnosis not present

## 2019-10-20 MED ORDER — PREDNISONE 10 MG PO TABS: ORAL_TABLET | ORAL | 0 refills | Status: DC

## 2019-10-20 MED ORDER — TRAMADOL HCL 50 MG PO TABS
50.0000 mg | ORAL_TABLET | Freq: Four times a day (QID) | ORAL | 0 refills | Status: DC | PRN
Start: 2019-10-20 — End: 2020-04-13

## 2019-10-20 MED ORDER — IBUPROFEN 600 MG PO TABS
600.0000 mg | ORAL_TABLET | Freq: Once | ORAL | Status: AC
  Administered 2019-10-20: 600 mg via ORAL
  Filled 2019-10-20: qty 1

## 2019-10-20 MED ORDER — DIPHENHYDRAMINE HCL 25 MG PO CAPS
25.0000 mg | ORAL_CAPSULE | Freq: Once | ORAL | Status: AC
  Administered 2019-10-20: 25 mg via ORAL
  Filled 2019-10-20: qty 1

## 2019-10-20 MED ORDER — PREDNISONE 20 MG PO TABS
60.0000 mg | ORAL_TABLET | Freq: Once | ORAL | Status: AC
  Administered 2019-10-20: 60 mg via ORAL
  Filled 2019-10-20: qty 3

## 2019-10-20 NOTE — ED Triage Notes (Signed)
Patient reports around 11:30 am she stepped on some fire ants.  States has an allergy to red ants.  States she is having continuing pain ans swelling to left lower leg since.

## 2019-10-20 NOTE — Discharge Instructions (Signed)
Follow-up with your primary care provider if any continued problems. Elevate today to help reduce some of the swelling. Began taking prednisone as directed and continue daily for the next 6 days. You may also take Benadryl 2 capsules every 6 hours as needed for itching which also help with localized reaction. Tramadol was also sent to your pharmacy which does not contain acetaminophen this is for pain. Do not drive or operate machinery while taking this medication as it could cause drowsiness. Watch for any signs of infection.

## 2019-10-20 NOTE — ED Provider Notes (Signed)
[facility] Emergency Department Provider Note  ____________________________________________   None    (approximate)  I have reviewed the triage vital signs and the nursing notes.   HISTORY  Chief Complaint Allergic Reaction    HPI Shirley Harris is a 49 y.o. female presents to the ED with complaint of left lower leg pain.  Patient states that she stepped on some fire aunts at approximately 11:30 AM yesterday and had some soreness.  She began swelling and has continued to have pain and swelling since that time.  She denies any difficulty breathing.  She states that she has been bitten by fire aunts before and had a lot of swelling.  While in the emergency department she was given diphenhydramine, prednisone 60 mg and ibuprofen because of the long wait time.  She rates her pain as 7 out of 10.         Past Medical History:  Diagnosis Date  . Chronic tension-type headache, not intractable 12/02/2016  . Hypertension     Patient Active Problem List   Diagnosis Date Noted  . Seasonal allergic rhinitis 06/25/2019  . Fibroids, submucosal 01/29/2018  . Hypertension 12/04/2017  . Overweight (BMI 25.0-29.9) 12/04/2017  . Migraine 12/04/2017  . Insomnia 12/04/2017  . Abnormal mammogram 05/14/2013    Past Surgical History:  Procedure Laterality Date  . BREAST BIOPSY Right 2015   Benign breast tissue with cystic and papillary apically metaplasia  . CESAREAN SECTION    . COLONOSCOPY WITH PROPOFOL N/A 02/21/2019   Procedure: COLONOSCOPY WITH PROPOFOL;  Surgeon: Lin Landsman, MD;  Location: [facility] ENDOSCOPY;  Service: Gastroenterology;  Laterality: N/A;  . LAPAROSCOPIC VAGINAL HYSTERECTOMY WITH SALPINGECTOMY Bilateral 01/29/2018   Procedure: LAPAROSCOPIC ASSISTED VAGINAL HYSTERECTOMY WITH BILATERAL SALPINGECTOMY;  Surgeon: Harlin Heys, MD;  Location: [facility] ORS;  Service: Gynecology;  Laterality: Bilateral;  . TUBAL LIGATION  2010    Prior to  Admission medications   Medication Sig Start Date End Date Taking? Authorizing Provider  albuterol (VENTOLIN HFA) 108 (90 Base) MCG/ACT inhaler Inhale 2 puffs into the lungs every 6 (six) hours as needed for wheezing or shortness of breath. 06/25/19   Delsa Grana, PA-C  clobetasol cream (TEMOVATE) 6.56 % APPLY 1 APPLICATION TOPICALLY 2 (TWO) TIMES DAILY FOR 30 DAYS THEN USE DAILY AS DIRECTED. 02/19/19   Harlin Heys, MD  cromolyn (OPTICROM) 4 % ophthalmic solution Place 1 drop into both eyes 4 (four) times daily as needed. 06/25/19   Delsa Grana, PA-C  fluticasone (FLONASE) 50 MCG/ACT nasal spray Place 2 sprays into both nostrils daily. Patient taking differently: Place 2 sprays into both nostrils as needed.  06/18/18   Hubbard Hartshorn, FNP  levocetirizine (XYZAL) 5 MG tablet Take 1 tablet (5 mg total) by mouth every evening. 06/18/18   Hubbard Hartshorn, FNP  lisinopril (ZESTRIL) 10 MG tablet Take 1 tablet (10 mg total) by mouth daily. 06/25/19   Delsa Grana, PA-C  montelukast (SINGULAIR) 10 MG tablet Take 1 tablet (10 mg total) by mouth at bedtime. 06/25/19 07/25/19  Delsa Grana, PA-C  predniSONE (DELTASONE) 10 MG tablet Take 6 tablets  today, on day 2 take 5 tablets, day 3 take 4 tablets, day 4 take 3 tablets, day 5 take  2 tablets and 1 tablet the last day 10/20/19   Johnn Hai, PA-C  rizatriptan (MAXALT) 10 MG tablet Take 1 tablet (10 mg total) by mouth as needed for migraine. May repeat in 2 hours if needed  06/25/19   Delsa Grana, PA-C  traMADol (ULTRAM) 50 MG tablet Take 1 tablet (50 mg total) by mouth every 6 (six) hours as needed. 10/20/19   Johnn Hai, PA-C    Allergies Acetaminophen and Shellfish allergy  Family History  Problem Relation Age of Onset  . Stroke Mother   . Multiple sclerosis Father   . Heart attack Maternal Grandfather   . Breast cancer Neg Hx     Social History Social History   Tobacco Use  . Smoking status: Never Smoker  . Smokeless tobacco: Never  Used  Vaping Use  . Vaping Use: Never used  Substance Use Topics  . Alcohol use: Not Currently  . Drug use: Never    Review of Systems Constitutional: No fever/chills Eyes: No visual changes. Cardiovascular: Denies chest pain. Respiratory: Denies shortness of breath. Gastrointestinal: No abdominal pain.  No nausea, no vomiting.  Musculoskeletal: Left lower leg pain. Skin: Left leg edema secondary to ant bites. Neurological: Negative for headaches, focal weakness or numbness. ____________________________________________   PHYSICAL EXAM:  VITAL SIGNS: ED Triage Vitals [10/20/19 0225]  Enc Vitals Group     BP      Pulse      Resp      Temp      Temp src      SpO2      Weight 178 lb (80.7 kg)     Height 5\' 5"  (1.651 m)     Head Circumference      Peak Flow      Pain Score 7     Pain Loc      Pain Edu?      Excl. in [place]?     Constitutional: Alert and oriented. Well appearing and in no acute distress. Eyes: Conjunctivae are normal.  Head: Atraumatic. Neck: No stridor.   Cardiovascular: Normal rate, regular rhythm. Grossly normal heart sounds.  Good peripheral circulation. Respiratory: Normal respiratory effort.  No retractions. Lungs CTAB. Gastrointestinal: Soft and nontender. No distention.  Musculoskeletal: Left lower leg, foot and ankle with soft tissue edema.  No discrete bite noted.  Erythematous and slightly warm to touch.  Pulses present.  Motor sensory function intact.  Patient walks with a limp secondary to pain. Neurologic:  Normal speech and language. No gross focal neurologic deficits are appreciated.  Skin:  Skin is warm, dry and intact.  Erythematous and swollen as noted above to the left foot and ankle area. Psychiatric: Mood and affect are normal. Speech and behavior are normal.  ____________________________________________   LABS (all labs ordered are listed, but only abnormal results are displayed)  Labs Reviewed - No data to  display  PROCEDURES  Procedure(s) performed (including Critical Care):  Procedures   ____________________________________________   INITIAL IMPRESSION / ASSESSMENT AND PLAN / ED COURSE  As part of my medical decision making, I reviewed the following data within the electronic MEDICAL RECORD NUMBER Notes from prior ED visits and [place] Controlled Substance Database  49 year old female presents to the ED with localized reaction to being bit by multiple fire ants.  Patient states that this occurred yesterday and is continued to swell and increase in pain.  Patient denies any difficulty breathing or shortness of breath.  She was already given Benadryl, prednisone and ibuprofen while in the waiting area.  Patient is moderately tender to the area.  A prescription for prednisone to continue over the next 6 days and tramadol every 6 hours as needed for pain.  She is  instructed to elevate her foot to help with swelling.  She is to return to the emergency room if any severe worsening of her symptoms and follow-up with her PCP.  ____________________________________________   FINAL CLINICAL IMPRESSION(S) / ED DIAGNOSES  Final diagnoses:  Fire ant bite, accidental or unintentional, initial encounter     ED Discharge Orders         Ordered    predniSONE (DELTASONE) 10 MG tablet        10/20/19 0723    traMADol (ULTRAM) 50 MG tablet  Every 6 hours PRN        10/20/19 0981          *Please note:  Shirley Harris was evaluated in Emergency Department on 10/20/2019 for the symptoms described in the history of present illness. She was evaluated in the context of the global COVID-19 pandemic, which necessitated consideration that the patient might be at risk for infection with the SARS-CoV-2 virus that causes COVID-19. Institutional protocols and algorithms that pertain to the evaluation of patients at risk for COVID-19 are in a state of rapid change based on information released by regulatory bodies including  the CDC and federal and state organizations. These policies and algorithms were followed during the patient's care in the ED.  Some ED evaluations and interventions may be delayed as a result of limited staffing during and the pandemic.*   Note:  This document was prepared using Dragon voice recognition software and may include unintentional dictation errors.    Johnn Hai, PA-C 10/20/19 0750    Arta Silence, MD 10/20/19 [phone]

## 2020-01-10 ENCOUNTER — Other Ambulatory Visit: Payer: Self-pay

## 2020-01-10 ENCOUNTER — Ambulatory Visit (INDEPENDENT_AMBULATORY_CARE_PROVIDER_SITE_OTHER): Payer: BC Managed Care – PPO | Admitting: Family Medicine

## 2020-01-10 ENCOUNTER — Encounter: Payer: Self-pay | Admitting: Family Medicine

## 2020-01-10 VITALS — BP 122/76 | HR 80 | Temp 98.1°F | Resp 16 | Ht 66.0 in | Wt 179.8 lb

## 2020-01-10 DIAGNOSIS — Z1231 Encounter for screening mammogram for malignant neoplasm of breast: Secondary | ICD-10-CM

## 2020-01-10 DIAGNOSIS — G43909 Migraine, unspecified, not intractable, without status migrainosus: Secondary | ICD-10-CM

## 2020-01-10 DIAGNOSIS — Z0000 Encounter for general adult medical examination without abnormal findings: Secondary | ICD-10-CM

## 2020-01-10 MED ORDER — NORTRIPTYLINE HCL 10 MG PO CAPS: 10.0000 mg | ORAL_CAPSULE | Freq: Every day | ORAL | 1 refills | Status: DC

## 2020-01-10 MED ORDER — RIZATRIPTAN BENZOATE 10 MG PO TABS: 10.0000 mg | ORAL_TABLET | ORAL | 5 refills | Status: DC | PRN

## 2020-01-10 NOTE — Patient Instructions (Addendum)
Health Maintenance  Topic Date Due  . Mammogram  12/28/2018  . Flu Shot  05/07/2020*  . Pap Smear  11/16/2020  . Tetanus Vaccine  12/05/2027  . COVID-19 Vaccine  Completed  .  Hepatitis C: One time screening is recommended by Center for Disease Control  (CDC) for  adults born from [birth year] through 1963.   Completed  . HIV Screening  Completed  *Topic was postponed. The date shown is not the original due date.     Preventive Care 55-47 Years Old, Female Preventive care refers to visits with your health care provider and lifestyle choices that can promote health and wellness. This includes:  A yearly physical exam. This may also be called an annual well check.  Regular dental visits and eye exams.  Immunizations.  Screening for certain conditions.  Healthy lifestyle choices, such as eating a healthy diet, getting regular exercise, not using drugs or products that contain nicotine and tobacco, and limiting alcohol use. What can I expect for my preventive care visit? Physical exam Your health care provider will check your:  Height and weight. This may be used to calculate body mass index (BMI), which tells if you are at a healthy weight.  Heart rate and blood pressure.  Skin for abnormal spots. Counseling Your health care provider may ask you questions about your:  Alcohol, tobacco, and drug use.  Emotional well-being.  Home and relationship well-being.  Sexual activity.  Eating habits.  Work and work Statistician.  Method of birth control.  Menstrual cycle.  Pregnancy history. What immunizations do I need?  Influenza (flu) vaccine  This is recommended every year. Tetanus, diphtheria, and pertussis (Tdap) vaccine  You may need a Td booster every 10 years. Varicella (chickenpox) vaccine  You may need this if you have not been vaccinated. Zoster (shingles) vaccine  You may need this after age 105. Measles, mumps, and rubella (MMR) vaccine  You may need at  least one dose of MMR if you were born in 1957 or later. You may also need a second dose. Pneumococcal conjugate (PCV13) vaccine  You may need this if you have certain conditions and were not previously vaccinated. Pneumococcal polysaccharide (PPSV23) vaccine  You may need one or two doses if you smoke cigarettes or if you have certain conditions. Meningococcal conjugate (MenACWY) vaccine  You may need this if you have certain conditions. Hepatitis A vaccine  You may need this if you have certain conditions or if you travel or work in places where you may be exposed to hepatitis A. Hepatitis B vaccine  You may need this if you have certain conditions or if you travel or work in places where you may be exposed to hepatitis B. Haemophilus influenzae type b (Hib) vaccine  You may need this if you have certain conditions. Human papillomavirus (HPV) vaccine  If recommended by your health care provider, you may need three doses over 6 months. You may receive vaccines as individual doses or as more than one vaccine together in one shot (combination vaccines). Talk with your health care provider about the risks and benefits of combination vaccines. What tests do I need? Blood tests  Lipid and cholesterol levels. These may be checked every 5 years, or more frequently if you are over 97 years old.  Hepatitis C test.  Hepatitis B test. Screening  Lung cancer screening. You may have this screening every year starting at age 69 if you have a 30-pack-year history of smoking and currently  smoke or have quit within the past 15 years.  Colorectal cancer screening. All adults should have this screening starting at age 79 and continuing until age 19. Your health care provider may recommend screening at age 91 if you are at increased risk. You will have tests every 1-10 years, depending on your results and the type of screening test.  Diabetes screening. This is done by checking your blood sugar  (glucose) after you have not eaten for a while (fasting). You may have this done every 1-3 years.  Mammogram. This may be done every 1-2 years. Talk with your health care provider about when you should start having regular mammograms. This may depend on whether you have a family history of breast cancer.  BRCA-related cancer screening. This may be done if you have a family history of breast, ovarian, tubal, or peritoneal cancers.  Pelvic exam and Pap test. This may be done every 3 years starting at age 35. Starting at age 51, this may be done every 5 years if you have a Pap test in combination with an HPV test. Other tests  Sexually transmitted disease (STD) testing.  Bone density scan. This is done to screen for osteoporosis. You may have this scan if you are at high risk for osteoporosis. Follow these instructions at home: Eating and drinking  Eat a diet that includes fresh fruits and vegetables, whole grains, lean protein, and low-fat dairy.  Take vitamin and mineral supplements as recommended by your health care provider.  Do not drink alcohol if: ? Your health care provider tells you not to drink. ? You are pregnant, may be pregnant, or are planning to become pregnant.  If you drink alcohol: ? Limit how much you have to 0-1 drink a day. ? Be aware of how much alcohol is in your drink. In the [place] one drink equals one 12 oz bottle of beer (355 mL), one 5 oz glass of wine (148 mL), or one 1 oz glass of hard liquor (44 mL). Lifestyle  Take daily care of your teeth and gums.  Stay active. Exercise for at least 30 minutes on 5 or more days each week.  Do not use any products that contain nicotine or tobacco, such as cigarettes, e-cigarettes, and chewing tobacco. If you need help quitting, ask your health care provider.  If you are sexually active, practice safe sex. Use a condom or other form of birth control (contraception) in order to prevent pregnancy and STIs (sexually  transmitted infections).  If told by your health care provider, take low-dose aspirin daily starting at age 1. What's next?  Visit your health care provider once a year for a well check visit.  Ask your health care provider how often you should have your eyes and teeth checked.  Stay up to date on all vaccines. This information is not intended to replace advice given to you by your health care provider. Make sure you discuss any questions you have with your health care provider. Document Revised: 10/05/2017 Document Reviewed: 10/05/2017 Elsevier Patient Education  2020 [employer].

## 2020-01-10 NOTE — Progress Notes (Signed)
Patient: Shirley Harris, Female    DOB: [birth year], 49 y.o.   MRN: [id number] Delsa Grana, PA-C Visit Date: 01/10/2020  Today's Provider: Delsa Grana, PA-C   Chief Complaint  Patient presents with  . Annual Exam   Subjective:   Annual physical exam:  Shirley Harris is a 49 y.o. female who presents today for complete physical exam:  Exercise/Activity: Exercising about 3 days a week for 30 minutes each Diet/nutrition: She was doing really good with her diet up until recently Sleep: Sleeping okay  TOTAL HYSTERECTOMY  WITH BILATERAL SALPINGECTOMY:  Migraine syndrome on maxalt and was on nortryptaline    USPSTF grade A and B recommendations - reviewed and addressed today  Depression:  Phq 9 completed today by patient, was reviewed by me with patient in the room PHQ score is negative, pt feels good, PHQ reviewed today PHQ 2/9 Scores 01/10/2020 06/25/2019 01/08/2019 12/25/2018  PHQ - 2 Score 0 0 0 0  PHQ- 9 Score - 0 0 0   Depression screen [facility] 2/9 01/10/2020 06/25/2019 01/08/2019 12/25/2018 06/18/2018  Decreased Interest 0 0 0 0 0  Down, Depressed, Hopeless 0 0 0 0 0  PHQ - 2 Score 0 0 0 0 0  Altered sleeping - 0 0 0 0  Tired, decreased energy - 0 0 0 0  Change in appetite - 0 0 0 0  Feeling bad or failure about yourself  - 0 0 0 0  Trouble concentrating - 0 0 0 0  Moving slowly or fidgety/restless - 0 0 0 0  Suicidal thoughts - 0 0 0 0  PHQ-9 Score - 0 0 0 0  Difficult doing work/chores - Not difficult at all Not difficult at all Not difficult at all Not difficult at all    Alcohol screening:   Office Visit from 01/10/2020 in [facility]  AUDIT-C Score 0      Immunizations and Health Maintenance: Health Maintenance  Topic Date Due  . INFLUENZA VACCINE  05/07/2020 (Originally 09/08/2019)  . PAP SMEAR-Modifier  11/16/2020  . TETANUS/TDAP  12/05/2027  . COVID-19 Vaccine  Completed  . Hepatitis C Screening  Completed  . HIV Screening  Completed      Hep C Screening: Done STD testing and prevention (HIV/chl/gon/syphilis):  see above, no additional testing desired by pt today  Intimate partner violence: Denies, feels safe  Sexual History/Pain during Intercourse: Married, no concerns  Menstrual History/LMP/Abnormal Bleeding: Normal/regular menses Patient's last menstrual period was 12/05/2017 (exact date).  Incontinence Symptoms: Denies  Breast cancer: Due for mammogram Last Mammogram: *see HM list above   Cervical cancer screening: Up-to-date  Osteoporosis:   Discussion on osteoporosis per age, including high calcium and vitamin D supplementation, weight bearing exercises  Skin cancer:  Hx of skin CA -  NO Discussed atypical lesions   Colorectal cancer:   Colonoscopy was recently done Discussed concerning signs and sx of CRC, pt denies any concerning symptoms  Lung cancer:   Low Dose CT Chest recommended if Age 75-80 years, 30 pack-year currently smoking OR have quit w/in 15years. Patient does not qualify.    Social History   Tobacco Use  . Smoking status: Never Smoker  . Smokeless tobacco: Never Used  Vaping Use  . Vaping Use: Never used  Substance Use Topics  . Alcohol use: Not Currently  . Drug use: Never       [facility] Visit from 01/10/2020 in [facility]  AUDIT-C Score  0      Family History  Problem Relation Age of Onset  . Stroke Mother   . Multiple sclerosis Father   . Heart attack Maternal Grandfather   . Breast cancer Neg Hx      Blood pressure/Hypertension: BP Readings from Last 3 Encounters:  01/10/20 122/76  10/20/19 124/87  06/25/19 122/78    Weight/Obesity: Wt Readings from Last 3 Encounters:  01/10/20 179 lb 12.8 oz (81.6 kg)  10/20/19 178 lb (80.7 kg)  06/25/19 184 lb 8 oz (83.7 kg)   BMI Readings from Last 3 Encounters:  01/10/20 29.02 kg/m  10/20/19 29.62 kg/m  06/25/19 29.78 kg/m     Lipids:  Lab Results  Component Value Date   CHOL 195  02/06/2019   CHOL 212 (H) 01/18/2018   Lab Results  Component Value Date   HDL 67 02/06/2019   HDL 74 01/18/2018   Lab Results  Component Value Date   LDLCALC 99 02/06/2019   LDLCALC 125 (H) 01/18/2018   Lab Results  Component Value Date   TRIG 171 (H) 02/06/2019   TRIG 67 01/18/2018   Lab Results  Component Value Date   CHOLHDL 2.9 02/06/2019   CHOLHDL 2.9 01/18/2018   No results found for: LDLDIRECT Based on the results of lipid panel his/her cardiovascular risk factor ( using [place] )  in the next 10 years is: The 10-year ASCVD risk score Mikey Bussing DC Brooke Bonito., et al., 2013) is: 1.5%   Values used to calculate the score:     Age: 26 years     Sex: Female     Is Non-Hispanic African American: Yes     Diabetic: No     Tobacco smoker: No     Systolic Blood Pressure: 245 mmHg     Is BP treated: Yes     HDL Cholesterol: 67 mg/dL     Total Cholesterol: 195 mg/dL Glucose:  Glucose  Date Value Ref Range Status  02/06/2019 106 (H) 65 - 99 mg/dL Final   Glucose, Bld  Date Value Ref Range Status  11/30/2016 91 65 - 99 mg/dL Final   Hypertension: BP Readings from Last 3 Encounters:  01/10/20 122/76  10/20/19 124/87  06/25/19 122/78   Obesity: Wt Readings from Last 3 Encounters:  01/10/20 179 lb 12.8 oz (81.6 kg)  10/20/19 178 lb (80.7 kg)  06/25/19 184 lb 8 oz (83.7 kg)   BMI Readings from Last 3 Encounters:  01/10/20 29.02 kg/m  10/20/19 29.62 kg/m  06/25/19 29.78 kg/m      Advanced Care Planning:  A voluntary discussion about advance care planning including the explanation and discussion of advance directives.   Discussed health care proxy and Living will, and the patient was able to identify a health care proxy as husband   Social History      She        Social History   Socioeconomic History  . Marital status: Married    Spouse name: [name]   . Number of children: 3  . Years of education: Not on file  . Highest education level: Not on file   Occupational History  . Not on file  Tobacco Use  . Smoking status: Never Smoker  . Smokeless tobacco: Never Used  Vaping Use  . Vaping Use: Never used  Substance and Sexual Activity  . Alcohol use: Not Currently  . Drug use: Never  . Sexual activity: Yes    Partners: Male    Birth  control/protection: Surgical  Other Topics Concern  . Not on file  Social History Narrative  . Not on file   Social Determinants of Health   Financial Resource Strain: Low Risk   . Difficulty of Paying Living Expenses: Not hard at all  Food Insecurity: No Food Insecurity  . Worried About Charity fundraiser in the Last Year: Never true  . Ran Out of Food in the Last Year: Never true  Transportation Needs: No Transportation Needs  . Lack of Transportation (Medical): No  . Lack of Transportation (Non-Medical): No  Physical Activity: Insufficiently Active  . Days of Exercise per Week: 3 days  . Minutes of Exercise per Session: 30 min  Stress: No Stress Concern Present  . Feeling of Stress : Not at all  Social Connections:   . Frequency of Communication with Friends and Family: Not on file  . Frequency of Social Gatherings with Friends and Family: Not on file  . Attends Religious Services: Not on file  . Active Member of Clubs or Organizations: Not on file  . Attends Archivist Meetings: Not on file  . Marital Status: Not on file    Family History        Family History  Problem Relation Age of Onset  . Stroke Mother   . Multiple sclerosis Father   . Heart attack Maternal Grandfather   . Breast cancer Neg Hx     Patient Active Problem List   Diagnosis Date Noted  . Seasonal allergic rhinitis 06/25/2019  . Fibroids, submucosal 01/29/2018  . Hypertension 12/04/2017  . Overweight (BMI 25.0-29.9) 12/04/2017  . Migraine 12/04/2017  . Insomnia 12/04/2017  . Abnormal mammogram 05/14/2013    Past Surgical History:  Procedure Laterality Date  . BREAST BIOPSY Right 2015    Benign breast tissue with cystic and papillary apically metaplasia  . CESAREAN SECTION    . COLONOSCOPY WITH PROPOFOL N/A 02/21/2019   Procedure: COLONOSCOPY WITH PROPOFOL;  Surgeon: Lin Landsman, MD;  Location: [facility] ENDOSCOPY;  Service: Gastroenterology;  Laterality: N/A;  . LAPAROSCOPIC VAGINAL HYSTERECTOMY WITH SALPINGECTOMY Bilateral 01/29/2018   Procedure: LAPAROSCOPIC ASSISTED VAGINAL HYSTERECTOMY WITH BILATERAL SALPINGECTOMY;  Surgeon: Harlin Heys, MD;  Location: [facility] ORS;  Service: Gynecology;  Laterality: Bilateral;  . TUBAL LIGATION  2010     Current Outpatient Medications:  .  albuterol (VENTOLIN HFA) 108 (90 Base) MCG/ACT inhaler, Inhale 2 puffs into the lungs every 6 (six) hours as needed for wheezing or shortness of breath., Disp: 18 g, Rfl: 2 .  clobetasol cream (TEMOVATE) 0.93 %, APPLY 1 APPLICATION TOPICALLY 2 (TWO) TIMES DAILY FOR 30 DAYS THEN USE DAILY AS DIRECTED., Disp: 60 g, Rfl: 0 .  fluticasone (FLONASE) 50 MCG/ACT nasal spray, Place 2 sprays into both nostrils daily. (Patient taking differently: Place 2 sprays into both nostrils as needed. ), Disp: 16 g, Rfl: 11 .  levocetirizine (XYZAL) 5 MG tablet, Take 1 tablet (5 mg total) by mouth every evening., Disp: 90 tablet, Rfl: 1 .  lisinopril (ZESTRIL) 10 MG tablet, Take 1 tablet (10 mg total) by mouth daily., Disp: 90 tablet, Rfl: 3 .  rizatriptan (MAXALT) 10 MG tablet, Take 1 tablet (10 mg total) by mouth as needed for migraine. May repeat in 2 hours if needed, Disp: 10 tablet, Rfl: 3 .  cromolyn (OPTICROM) 4 % ophthalmic solution, Place 1 drop into both eyes 4 (four) times daily as needed. (Patient not taking: Reported on 01/10/2020), Disp: 10 mL, Rfl:  2 .  montelukast (SINGULAIR) 10 MG tablet, Take 1 tablet (10 mg total) by mouth at bedtime., Disp: 90 tablet, Rfl: 3 .  traMADol (ULTRAM) 50 MG tablet, Take 1 tablet (50 mg total) by mouth every 6 (six) hours as needed. (Patient not taking: Reported on 01/10/2020),  Disp: 20 tablet, Rfl: 0  Allergies  Allergen Reactions  . Acetaminophen Itching, Swelling and Other (See Comments)    Also notes BURNING  . Shellfish Allergy Itching and Other (See Comments)    Itching of tongue and throat    Patient Care Team: Delsa Grana, PA-C as PCP - General (Family Medicine) Kate Sable, MD as PCP - Cardiology (Cardiology)  Review of Systems  10 Systems reviewed and are negative for acute change except as noted in the HPI.   I personally reviewed active problem list, medication list, allergies, family history, social history, health maintenance, notes from last encounter, lab results, imaging with the patient/caregiver today.       Objective:   Vitals:  Vitals:   01/10/20 0824  BP: 122/76  Pulse: 80  Resp: 16  Temp: 98.1 F (36.7 C)  TempSrc: Oral  SpO2: 98%  Weight: 179 lb 12.8 oz (81.6 kg)  Height: 5\' 6"  (1.676 m)    Body mass index is 29.02 kg/m.  Physical Exam Vitals and nursing note reviewed.  Constitutional:      General: She is not in acute distress.    Appearance: Normal appearance. She is well-developed. She is not ill-appearing, toxic-appearing or diaphoretic.     Interventions: Face mask in place.  HENT:     Head: Normocephalic and atraumatic.     Right Ear: External ear normal.     Left Ear: External ear normal.  Eyes:     General: Lids are normal. No scleral icterus.       Right eye: No discharge.        Left eye: No discharge.     Conjunctiva/sclera: Conjunctivae normal.  Neck:     Trachea: Phonation normal. No tracheal deviation.  Cardiovascular:     Rate and Rhythm: Normal rate and regular rhythm.     Pulses: Normal pulses.          Radial pulses are 2+ on the right side and 2+ on the left side.       Posterior tibial pulses are 2+ on the right side and 2+ on the left side.     Heart sounds: Normal heart sounds. No murmur heard.  No friction rub. No gallop.   Pulmonary:     Effort: Pulmonary effort is  normal. No respiratory distress.     Breath sounds: Normal breath sounds. No stridor. No wheezing, rhonchi or rales.  Chest:     Chest wall: No tenderness.  Abdominal:     General: Bowel sounds are normal. There is no distension.     Palpations: Abdomen is soft.     Tenderness: There is no abdominal tenderness. There is no guarding.  Musculoskeletal:     Right lower leg: No edema.     Left lower leg: No edema.  Skin:    General: Skin is warm and dry.     Coloration: Skin is not jaundiced or pale.     Findings: No rash.  Neurological:     Mental Status: She is alert.     Motor: No abnormal muscle tone.     Gait: Gait normal.  Psychiatric:        Mood  and Affect: Mood normal.        Speech: Speech normal.        Behavior: Behavior normal.       Fall Risk: Fall Risk  01/10/2020 06/25/2019 01/08/2019 12/25/2018 06/18/2018  Falls in the past year? 0 0 0 0 0  Number falls in past yr: 0 0 0 0 0  Injury with Fall? 0 0 0 0 0  Follow up Falls evaluation completed - Falls evaluation completed - Falls evaluation completed    Functional Status Survey: Is the patient deaf or have difficulty hearing?: No Does the patient have difficulty seeing, even when wearing glasses/contacts?: No Does the patient have difficulty concentrating, remembering, or making decisions?: No Does the patient have difficulty walking or climbing stairs?: No Does the patient have difficulty dressing or bathing?: No Does the patient have difficulty doing errands alone such as visiting a doctor's office or shopping?: No   Assessment & Plan:    CPE completed today  . USPSTF grade A and B recommendations reviewed with patient; age-appropriate recommendations, preventive care, screening tests, etc discussed and encouraged; healthy living encouraged; see AVS for patient education given to patient  . Discussed importance of 150 minutes of physical activity weekly, AHA exercise recommendations given to pt in  AVS/handout  . Discussed importance of healthy diet:  eating lean meats and proteins, avoiding trans fats and saturated fats, avoid simple sugars and excessive carbs in diet, eat 6 servings of fruit/vegetables daily and drink plenty of water and avoid sweet beverages.    . Recommended pt to do annual eye exam and routine dental exams/cleanings  . Depression, alcohol, fall screening completed as documented above and per flowsheets  . Reviewed Health Maintenance: Health Maintenance  Topic Date Due  . INFLUENZA VACCINE  05/07/2020 (Originally 09/08/2019)  . PAP SMEAR-Modifier  11/16/2020  . TETANUS/TDAP  12/05/2027  . COVID-19 Vaccine  Completed  . Hepatitis C Screening  Completed  . HIV Screening  Completed    . Immunizations: Immunization History  Administered Date(s) Administered  . Influenza,inj,Quad PF,6+ Mos 12/04/2017  . PFIZER SARS-COV-2 Vaccination 09/11/2019, 10/02/2019  . Tdap 12/04/2017         ICD-10-CM   1. Adult general medical exam  Z00.00 CBC with Differential/Platelet    Lipid panel    Comprehensive metabolic panel  2. Encounter for screening mammogram for malignant neoplasm of breast  Z12.31 MM 3D SCREEN BREAST BILATERAL  3. Migraine without status migrainosus, not intractable, unspecified migraine type  G43.909 rizatriptan (MAXALT) 10 MG tablet    nortriptyline (PAMELOR) 10 MG capsule   Headaches returning she was off nortriptyline, restart lower dose and follow-up with neurology, Maxalt refilled     Delsa Grana, PA-C 01/10/20 8:36 AM  [facility]

## 2020-04-13 ENCOUNTER — Other Ambulatory Visit: Payer: Self-pay

## 2020-04-13 ENCOUNTER — Encounter: Payer: Self-pay | Admitting: Family Medicine

## 2020-04-13 ENCOUNTER — Ambulatory Visit: Payer: BC Managed Care – PPO | Admitting: Family Medicine

## 2020-04-13 VITALS — BP 120/90 | HR 94 | Temp 98.8°F | Resp 16 | Ht 66.0 in | Wt 179.5 lb

## 2020-04-13 DIAGNOSIS — M545 Low back pain, unspecified: Secondary | ICD-10-CM | POA: Diagnosis not present

## 2020-04-13 DIAGNOSIS — R350 Frequency of micturition: Secondary | ICD-10-CM

## 2020-04-13 DIAGNOSIS — M549 Dorsalgia, unspecified: Secondary | ICD-10-CM | POA: Insufficient documentation

## 2020-04-13 LAB — POCT URINALYSIS DIPSTICK
Appearance: NORMAL
Bilirubin, UA: NEGATIVE
Blood, UA: NEGATIVE
Glucose, UA: NEGATIVE
Ketones, UA: NEGATIVE
Leukocytes, UA: NEGATIVE
Nitrite, UA: NEGATIVE
Odor: NORMAL
Protein, UA: POSITIVE — AB
Spec Grav, UA: 1.025 (ref 1.010–1.025)
Urobilinogen, UA: 0.2 E.U./dL
pH, UA: 6 (ref 5.0–8.0)

## 2020-04-13 MED ORDER — CYCLOBENZAPRINE HCL 5 MG PO TABS: 5.0000 mg | ORAL_TABLET | Freq: Two times a day (BID) | ORAL | 0 refills | Status: DC | PRN

## 2020-04-13 NOTE — Assessment & Plan Note (Signed)
Likely MSK etiology given paravertebral hypertonicity, TTP, and relief with heat and NSAIDs with unknown inciting event. No red flags on history or exam. UA unremarkable. Will trial short course of muscle relaxer, handout provided on stretching. Continue heat and NSAIDs prn. F/u prn. Emergency precautions reviewed.

## 2020-04-13 NOTE — Progress Notes (Signed)
° ° °  SUBJECTIVE:   CHIEF COMPLAINT / HPI:   BACK PAIN  Duration: 2 weeks Mechanism of injury: unknown Location: R low back Onset: unsure Severity: mild  Quality: dull ache Frequency: constant Radiation: none Aggravating factors: walking for prolonged periods Alleviating factors: heat, ibuprofen Status: fluctuating Treatments attempted: heat and ibuprofen  Relief with NSAIDs?: mild Nighttime pain:  no Paresthesias / decreased sensation:  no Bowel / bladder incontinence:  no Fevers:  no Dysuria / urinary frequency:  maybe increased frequency. - trying to drink more water  Dysuria: no Urinary frequency: yes Urgency: yes Small volume voids: occasionally Urinary incontinence: no Foul odor: yes Hematuria: no Abdominal pain: no Back pain: no Suprapubic pain/pressure: only once Flank pain: yes Fever:  no Vomiting: no Relief with cranberry juice: hasn't tried Relief with pyridium: hasn't tried Status: better/worse/stable Previous urinary tract infection: no Recurrent urinary tract infection: no Sexual activity: monogomous History of sexually transmitted disease: no Vaginal discharge: no Treatments attempted: heat, ibuprofen and increasing fluids    OBJECTIVE:   BP 120/90    Pulse 94    Temp 98.8 F (37.1 C) (Oral)    Resp 16    Ht 5\' 6"  (1.676 m)    Wt 179 lb 8 oz (81.4 kg)    LMP 12/05/2017 (Exact Date)    SpO2 98%    BMI 28.97 kg/m   Gen: well appearing, in NAD Abd: soft, slightly TTP in RLQ. +BS. Negative Murphy sign. nontender over Mcburney's point. No suprapubic tenderness.  Ext: WWP, no edema MSK: no midline spinal tenderness. Slight TTP to R lower back with associated paravertebral hypertonicity. Full AROM without pain.    ASSESSMENT/PLAN:   Back pain Likely MSK etiology given paravertebral hypertonicity, TTP, and relief with heat and NSAIDs with unknown inciting event. No red flags on history or exam. UA unremarkable. Will trial short course of muscle  relaxer, handout provided on stretching. Continue heat and NSAIDs prn. F/u prn. Emergency precautions reviewed.   Myles Gip, DO

## 2020-04-13 NOTE — Patient Instructions (Signed)
It was great to see you!  Our plans for today:  - take the muscle relaxer as needed for pain.  - Try the stretches below to help ease your back pain. Try using a heating pad to loosen up the muscles prior to stretching to help with pain.  - If you develop fevers, loss of bowel or bladder control, numbness, let us know.   Take care and seek immediate care sooner if you develop any concerns.   Dr. Ky Barban   . Lie on your back. Bend your right knee so that your right foot is flat on the floor. Shirley Harris your left leg over your right so that your left ankle rests on your right knee. . Use your hands to grab hold of your left knee and pull it gently toward the opposite shoulder. You should feel the stretch in your buttocks and hips. . Hold for 15 to 30 seconds. . Relax, and then repeat with the other leg. . Repeat this cycle 2 to 4 times.   Lie on your back in a doorway, with one leg through the open door. Slide your leg up the wall to straighten your knee. You should feel a gentle stretch down the back of your leg. Hold the stretch for at least 1 minute. As the days go by, add a little more time until you can relax and let these muscles stretch for as much as 6 minutes for each leg. Do not arch your back. Do not bend either knee. Keep one heel touching the floor and the other heel touching the wall. Do not point your toes. Repeat with your other leg. Do 2 to 4 times for each leg. If you do not have a place to do this exercise in a doorway, there is another way to do it:  Lie on your back and bend the knee of the leg you want to stretch. Loop a towel under the ball and toes of that foot, and hold the ends of the towel in your hands. Straighten your knee and slowly pull back on the towel. You should feel a gentle stretch down the back of your leg. It is hard to hold this stretch with a towel for a long time, but hold the stretch for at least 15 to 30 seconds. One minute or more is even  better. Repeat with your other leg. Do 2 to 4 times for each leg.   Child's Pose 1. Kneel on the floor with your toes together and your knees hip-width apart. Rest your palms on top of your thighs. 2. On an exhale, lower your torso between your knees. Extend your arms alongside your torso with your palms facing down. Relax your shoulders toward the ground. Rest in the pose for as long as needed.

## 2020-07-10 ENCOUNTER — Ambulatory Visit: Payer: BC Managed Care – PPO | Admitting: Family Medicine

## 2020-09-14 ENCOUNTER — Ambulatory Visit: Payer: BC Managed Care – PPO | Admitting: Family Medicine

## 2020-09-14 ENCOUNTER — Other Ambulatory Visit: Payer: Self-pay

## 2020-09-14 ENCOUNTER — Encounter: Payer: Self-pay | Admitting: Family Medicine

## 2020-09-14 VITALS — BP 122/82 | HR 94 | Temp 98.3°F | Resp 16 | Ht 66.0 in | Wt 177.2 lb

## 2020-09-14 DIAGNOSIS — M545 Low back pain, unspecified: Secondary | ICD-10-CM | POA: Diagnosis not present

## 2020-09-14 DIAGNOSIS — N6320 Unspecified lump in the left breast, unspecified quadrant: Secondary | ICD-10-CM | POA: Diagnosis not present

## 2020-09-14 NOTE — Patient Instructions (Signed)
[facility] at [facility] [place],  [place]  [address] Get Driving Directions Main: [phone]   Call to schedule

## 2020-09-14 NOTE — Progress Notes (Signed)
Patient ID: Shirley Harris, female    DOB: 1970/08/07, 50 y.o.   MRN: [id number]  PCP: Delsa Grana, PA-C  Chief Complaint  Patient presents with   Breast Mass    left   Back Pain    Went to urgent care 1 week ago, neg urine workup was given anti inflammatory and muscle relaxer.  Pt denies taking muscle relaxer and no trauma    Subjective:   Shirley Harris is a 50 y.o. female, presents to clinic with CC of the following:  Back Pain This is a recurrent problem. The current episode started more than 1 month ago. The problem occurs constantly. The problem is unchanged. The pain is present in the lumbar spine. The quality of the pain is described as aching. The pain does not radiate. The pain is moderate. The symptoms are aggravated by standing and position. Pertinent negatives include no abdominal pain, bladder incontinence, bowel incontinence, chest pain, dysuria, fever, headaches, leg pain, numbness, paresis, paresthesias, pelvic pain, perianal numbness, tingling, weakness or weight loss. She has tried muscle relaxant and heat for the symptoms. The treatment provided mild relief.    Left breast lump she noticed int he shower last week - she previously has had abnormal mammogram and [place] prior bx in 2015 was benign, dense breast, s/p hysterectomy, did physical last Dec and mammogram was ordered but she did not complete Last done was 2019 - reviewed today She denies any skin changes, nipple discharge, lymphadenopathy, weight loss    Patient Active Problem List   Diagnosis Date Noted   Back pain 04/13/2020   Seasonal allergic rhinitis 06/25/2019   Fibroids, submucosal 01/29/2018   Hypertension 12/04/2017   Overweight (BMI 25.0-29.9) 12/04/2017   Migraine 12/04/2017   Insomnia 12/04/2017   Abnormal mammogram 05/14/2013      Current Outpatient Medications:    albuterol (VENTOLIN HFA) 108 (90 Base) MCG/ACT inhaler, Inhale 2 puffs into the lungs every 6 (six) hours as needed for wheezing  or shortness of breath., Disp: 18 g, Rfl: 2   clobetasol cream (TEMOVATE) [id number] %, APPLY 1 APPLICATION TOPICALLY 2 (TWO) TIMES DAILY FOR 30 DAYS THEN USE DAILY AS DIRECTED., Disp: 60 g, Rfl: 0   cyclobenzaprine (FLEXERIL) 5 MG tablet, Take 1 tablet (5 mg total) by mouth 2 (two) times daily as needed for muscle spasms., Disp: 30 tablet, Rfl: 0   diclofenac (VOLTAREN) 75 MG EC tablet, Take 75 mg by mouth 2 (two) times daily., Disp: , Rfl:    fluticasone (FLONASE) 50 MCG/ACT nasal spray, Place 2 sprays into both nostrils daily. (Patient taking differently: Place 2 sprays into both nostrils as needed.), Disp: 16 g, Rfl: 11   levocetirizine (XYZAL) 5 MG tablet, Take 1 tablet (5 mg total) by mouth every evening., Disp: 90 tablet, Rfl: 1   lisinopril (ZESTRIL) 10 MG tablet, Take 1 tablet (10 mg total) by mouth daily., Disp: 90 tablet, Rfl: 3   loratadine (CLARITIN) 10 MG tablet, Take by mouth., Disp: , Rfl:    nortriptyline (PAMELOR) 10 MG capsule, Take 1 capsule (10 mg total) by mouth at bedtime., Disp: 90 capsule, Rfl: 1   rizatriptan (MAXALT) 10 MG tablet, Take 1 tablet (10 mg total) by mouth as needed for migraine. May repeat in 2 hours if needed, Disp: 10 tablet, Rfl: 5   cyclobenzaprine (FLEXERIL) 10 MG tablet, Take 10 mg by mouth 3 (three) times daily as needed. (Patient not taking: Reported on 09/14/2020), Disp: , Rfl:  montelukast (SINGULAIR) 10 MG tablet, Take 1 tablet (10 mg total) by mouth at bedtime., Disp: 90 tablet, Rfl: 3   Allergies  Allergen Reactions   Acetaminophen Itching, Swelling and Other (See Comments)    Also notes BURNING   Shellfish Allergy Itching and Other (See Comments)    Itching of tongue and throat     Social History   Tobacco Use   Smoking status: Never   Smokeless tobacco: Never  Vaping Use   Vaping Use: Never used  Substance Use Topics   Alcohol use: Not Currently   Drug use: Never      Chart Review Today: I personally reviewed active problem list,  medication list, allergies, family history, social history, health maintenance, notes from last encounter, lab results, imaging with the patient/caregiver today.   Review of Systems  Constitutional: Negative.  Negative for fever and weight loss.  HENT: Negative.    Eyes: Negative.   Respiratory: Negative.    Cardiovascular: Negative.  Negative for chest pain.  Gastrointestinal: Negative.  Negative for abdominal pain and bowel incontinence.  Endocrine: Negative.   Genitourinary: Negative.  Negative for bladder incontinence, dysuria and pelvic pain.  Musculoskeletal:  Positive for back pain.  Skin: Negative.   Allergic/Immunologic: Negative.   Neurological: Negative.  Negative for tingling, weakness, numbness, headaches and paresthesias.  Hematological: Negative.   Psychiatric/Behavioral: Negative.    All other systems reviewed and are negative.     Objective:   Vitals:   09/14/20 1055  BP: 122/82  Pulse: 94  Resp: 16  Temp: 98.3 F (36.8 C)  SpO2: 99%  Weight: 177 lb 3.2 oz (80.4 kg)  Height: '5\' 6"'$  (1.676 m)    Body mass index is 28.6 kg/m.  Physical Exam Vitals and nursing note reviewed. Exam conducted with a chaperone present.  Constitutional:      General: She is not in acute distress.    Appearance: Normal appearance. She is not ill-appearing, toxic-appearing or diaphoretic.  HENT:     Head: Normocephalic and atraumatic.  Cardiovascular:     Rate and Rhythm: Normal rate.     Pulses: Normal pulses.  Pulmonary:     Effort: Pulmonary effort is normal.  Chest:  Breasts:    Right: Normal. No swelling, bleeding, inverted nipple, mass, nipple discharge, skin change, tenderness, axillary adenopathy or supraclavicular adenopathy.     Left: Mass present. No swelling, bleeding, inverted nipple, nipple discharge, skin change, tenderness, axillary adenopathy or supraclavicular adenopathy.     Comments: Left breast lump palpated 12 - 1 o'clock, firm, non-mobile, long and  flat feeling roughly 2x1 cm  Musculoskeletal:     Cervical back: Normal.     Thoracic back: Normal. No swelling, edema, deformity, spasms, tenderness or bony tenderness.     Lumbar back: Normal.     Right lower leg: No edema.     Left lower leg: No edema.     Comments: No midline tenderness from cervical to lumbar spine, no step off No paraspinal muscle ttp from cervical to lumbar spine Grossly normal ROM of back 5/5 strength bilaterally with dorsiflexion, plantarflexion, flexion and extension at knees, and flexion and extension at hips Grossly normal sensation to light touch to bilateral lower extremities Normal gait Neg SLR bilaterally Hyperlordosis     Lymphadenopathy:     Upper Body:     Right upper body: No supraclavicular, axillary or pectoral adenopathy.     Left upper body: No supraclavicular, axillary or pectoral adenopathy.  Skin:  General: Skin is warm and dry.     Coloration: Skin is not jaundiced or pale.     Findings: No erythema, lesion or rash.  Neurological:     Mental Status: She is alert. Mental status is at baseline.     Gait: Gait normal.  Psychiatric:        Mood and Affect: Mood normal.        Behavior: Behavior normal.     Results for orders placed or performed in visit on 04/13/20  POCT Urinalysis Dipstick  Result Value Ref Range   Color, UA Yellow    Clarity, UA Clear    Glucose, UA Negative Negative   Bilirubin, UA Negative    Ketones, UA Negative    Spec Grav, UA 1.025 1.010 - 1.025   Blood, UA Negative    pH, UA 6.0 5.0 - 8.0   Protein, UA Positive (A) Negative   Urobilinogen, UA 0.2 0.2 or 1.0 E.U./dL   Nitrite, UA Negative    Leukocytes, UA Negative Negative   Appearance Normal    Odor Normal        Assessment & Plan:     ICD-10-CM   1. Left breast lump  N63.20 MM Digital Diagnostic Bilat    US BREAST COMPLETE UNI LEFT INC AXILLA   noticed last week, palpable on exam - imaging and f/up pending results, hx of fibrocystic breast  changes    2. Bilateral low back pain without sciatica, unspecified chronicity  M54.50 DG Lumbar Spine Complete    Ambulatory referral to Physical Therapy   intermittent over the past 2 years, reoccured and not resolved x 2 months, UC r/o urine/UTI/kidney infection, no injury, screening xray and PT      Return if symptoms worsen or fail to improve, for 1-2 months for back pain . F/up on back pain - to monitor - see if she needs referral to specialists or MRI? Conservative management NSAIDS, PT eval and tx, she has muscle relaxers, doing heat therapy     Delsa Grana, PA-C 09/14/20 11:21 AM

## 2020-09-15 ENCOUNTER — Ambulatory Visit
Admission: RE | Admit: 2020-09-15 | Discharge: 2020-09-15 | Disposition: A | Discharge status: Home / Self Care | Payer: BC Managed Care – PPO | Source: Ambulatory Visit | Attending: Family Medicine | Admitting: Family Medicine

## 2020-09-15 ENCOUNTER — Ambulatory Visit
Admission: RE | Admit: 2020-09-15 | Discharge: 2020-09-15 | Disposition: A | Discharge status: Home / Self Care | Payer: BC Managed Care – PPO | Attending: Family Medicine | Admitting: Family Medicine

## 2020-09-15 DIAGNOSIS — M545 Low back pain, unspecified: Secondary | ICD-10-CM | POA: Insufficient documentation

## 2020-09-18 ENCOUNTER — Ambulatory Visit: Payer: BC Managed Care – PPO | Admitting: Family Medicine

## 2020-09-18 ENCOUNTER — Other Ambulatory Visit: Payer: Self-pay

## 2020-09-18 ENCOUNTER — Ambulatory Visit
Admission: RE | Admit: 2020-09-18 | Discharge: 2020-09-18 | Disposition: A | Discharge status: Home / Self Care | Payer: BC Managed Care – PPO | Source: Ambulatory Visit | Attending: Family Medicine | Admitting: Family Medicine

## 2020-09-18 DIAGNOSIS — N6320 Unspecified lump in the left breast, unspecified quadrant: Secondary | ICD-10-CM | POA: Insufficient documentation

## 2020-11-20 ENCOUNTER — Ambulatory Visit: Payer: BC Managed Care – PPO | Admitting: Family Medicine

## 2020-12-11 ENCOUNTER — Encounter: Payer: Self-pay | Admitting: Family Medicine

## 2020-12-11 ENCOUNTER — Other Ambulatory Visit: Payer: Self-pay

## 2020-12-11 ENCOUNTER — Ambulatory Visit (INDEPENDENT_AMBULATORY_CARE_PROVIDER_SITE_OTHER): Payer: BC Managed Care – PPO | Admitting: Family Medicine

## 2020-12-11 VITALS — BP 130/72 | HR 92 | Temp 98.0°F | Resp 16 | Ht 66.0 in | Wt 177.0 lb

## 2020-12-11 DIAGNOSIS — H9191 Unspecified hearing loss, right ear: Secondary | ICD-10-CM | POA: Diagnosis not present

## 2020-12-11 DIAGNOSIS — I10 Essential (primary) hypertension: Secondary | ICD-10-CM | POA: Diagnosis not present

## 2020-12-11 DIAGNOSIS — M545 Low back pain, unspecified: Secondary | ICD-10-CM | POA: Diagnosis not present

## 2020-12-11 DIAGNOSIS — J343 Hypertrophy of nasal turbinates: Secondary | ICD-10-CM

## 2020-12-11 DIAGNOSIS — J309 Allergic rhinitis, unspecified: Secondary | ICD-10-CM | POA: Diagnosis not present

## 2020-12-11 MED ORDER — LISINOPRIL 5 MG PO TABS: 5.0000 mg | ORAL_TABLET | Freq: Every day | ORAL | 3 refills | Status: DC

## 2020-12-11 MED ORDER — CYCLOBENZAPRINE HCL 5 MG PO TABS: 5.0000 mg | ORAL_TABLET | Freq: Two times a day (BID) | ORAL | 0 refills | Status: DC | PRN

## 2020-12-11 NOTE — Addendum Note (Signed)
Addended by: Delsa Grana on: 12/11/2020 04:02 PM   Modules accepted: Orders

## 2020-12-11 NOTE — Patient Instructions (Signed)
Strongly recommend conservative measures continued - heat, avoid heavy lifting, NSAIDs if tolerated  And also recommend screening Xrays and a Physical Therapy consult for full assessment   Acute Back Pain, Adult Acute back pain is sudden and usually short-lived. It is often caused by an injury to the muscles and tissues in the back. The injury may result from: A muscle, tendon, or ligament getting overstretched or torn. Ligaments are tissues that connect bones to each other. Lifting something improperly can cause a back strain. Wear and tear (degeneration) of the spinal disks. Spinal disks are circular tissue that provide cushioning between the bones of the spine (vertebrae). Twisting motions, such as while playing sports or doing yard work. A hit to the back. Arthritis. You may have a physical exam, lab tests, and imaging tests to find the cause of your pain. Acute back pain usually goes away with rest and home care. Follow these instructions at home: Managing pain, stiffness, and swelling Take over-the-counter and prescription medicines only as told by your health care provider. Treatment may include medicines for pain and inflammation that are taken by mouth or applied to the skin, or muscle relaxants. Your health care provider may recommend applying ice during the first 24-48 hours after your pain starts. To do this: Put ice in a plastic bag. Place a towel between your skin and the bag. Leave the ice on for 20 minutes, 2-3 times a day. Remove the ice if your skin turns bright red. This is very important. If you cannot feel pain, heat, or cold, you have a greater risk of damage to the area. If directed, apply heat to the affected area as often as told by your health care provider. Use the heat source that your health care provider recommends, such as a moist heat pack or a heating pad. Place a towel between your skin and the heat source. Leave the heat on for 20-30 minutes. Remove the heat  if your skin turns bright red. This is especially important if you are unable to feel pain, heat, or cold. You have a greater risk of getting burned. Activity  Do not stay in bed. Staying in bed for more than 1-2 days can delay your recovery. Sit up and stand up straight. Avoid leaning forward when you sit or hunching over when you stand. If you work at a desk, sit close to it so you do not need to lean over. Keep your chin tucked in. Keep your neck drawn back, and keep your elbows bent at a 90-degree angle (right angle). Sit high and close to the steering wheel when you drive. Add lower back (lumbar) support to your car seat, if needed. Take short walks on even surfaces as soon as you are able. Try to increase the length of time you walk each day. Do not sit, drive, or stand in one place for more than 30 minutes at a time. Sitting or standing for long periods of time can put stress on your back. Do not drive or use heavy machinery while taking prescription pain medicine. Use proper lifting techniques. When you bend and lift, use positions that put less stress on your back: [place] your knees. Keep the load close to your body. Avoid twisting. Exercise regularly as told by your health care provider. Exercising helps your back heal faster and helps prevent back injuries by keeping muscles strong and flexible. Work with a physical therapist to make a safe exercise program, as recommended by your health care  provider. Do any exercises as told by your physical therapist. Lifestyle Maintain a healthy weight. Extra weight puts stress on your back and makes it difficult to have good posture. Avoid activities or situations that make you feel anxious or stressed. Stress and anxiety increase muscle tension and can make back pain worse. Learn ways to manage anxiety and stress, such as through exercise. General instructions Sleep on a firm mattress in a comfortable position. Try lying on your side with your  knees slightly bent. If you lie on your back, put a pillow under your knees. Keep your head and neck in a straight line with your spine (neutral position) when using electronic equipment like smartphones or pads. To do this: Raise your smartphone or pad to look at it instead of bending your head or neck to look down. Put the smartphone or pad at the level of your face while looking at the screen. Follow your treatment plan as told by your health care provider. This may include: Cognitive or behavioral therapy. Acupuncture or massage therapy. Meditation or yoga. Contact a health care provider if: You have pain that is not relieved with rest or medicine. You have increasing pain going down into your legs or buttocks. Your pain does not improve after 2 weeks. You have pain at night. You lose weight without trying. You have a fever or chills. You develop nausea or vomiting. You develop abdominal pain. Get help right away if: You develop new bowel or bladder control problems. You have unusual weakness or numbness in your arms or legs. You feel faint. These symptoms may represent a serious problem that is an emergency. Do not wait to see if the symptoms will go away. Get medical help right away. Call your local emergency services (911 in the [place] Do not drive yourself to the hospital. Summary Acute back pain is sudden and usually short-lived. Use proper lifting techniques. When you bend and lift, use positions that put less stress on your back. Take over-the-counter and prescription medicines only as told by your health care provider, and apply heat or ice as told. This information is not intended to replace advice given to you by your health care provider. Make sure you discuss any questions you have with your health care provider. Document Revised: 04/17/2020 Document Reviewed: 04/17/2020 Elsevier Patient Education  [place].

## 2020-12-11 NOTE — Progress Notes (Signed)
Patient ID: Shirley Harris, female    DOB: [birth year], 50 y.o.   MRN: [id number]  PCP: Delsa Grana, PA-C  Chief Complaint  Patient presents with   Back Pain    Pt states has improved.    Subjective:   Shirley Harris is a 50 y.o. female, presents to clinic with CC of the following:  Back pain 1-2 months Pt previously evaluated for similar by Dr. Ky Barban March 2022 HPI: BACK PAIN   Duration: 2 weeks Mechanism of injury: unknown Location: R low back Onset: unsure Severity: mild  Quality: dull ache Frequency: constant Radiation: none Aggravating factors: walking for prolonged periods Alleviating factors: heat, ibuprofen Status: fluctuating Treatments attempted: heat and ibuprofen  Relief with NSAIDs?: mild Nighttime pain:  no Paresthesias / decreased sensation:  no Bowel / bladder incontinence:  no Fevers:  no Dysuria / urinary frequency:  maybe increased frequency. - trying to drink more water   Dysuria: no Urinary frequency: yes Urgency: yes Small volume voids: occasionally Urinary incontinence: no Foul odor: yes Hematuria: no Abdominal pain: no Back pain: no Suprapubic pain/pressure: only once Flank pain: yes Fever:  no Vomiting: no Relief with cranberry juice: hasn't tried Relief with pyridium: hasn't tried Status: better/worse/stable Previous urinary tract infection: no Recurrent urinary tract infection: no Sexual activity: monogomous History of sexually transmitted disease: no Vaginal discharge: no Treatments attempted: heat, ibuprofen and increasing fluids   A&P: Back pain Likely MSK etiology given paravertebral hypertonicity, TTP, and relief with heat and NSAIDs with unknown inciting event. No red flags on history or exam. UA unremarkable. Will trial short course of muscle relaxer, handout provided on stretching. Continue heat and NSAIDs prn. F/u prn. Emergency precautions reviewed.   Myles Gip, DO  Interval Hx: Overall improved with  conservative mgmt and good compliance with PT - still flares up every once in a while, she does heat, home exercises would like refill on muscle relaxer  Hypertension:  Previously managed on lisinopril 10 mg - but she has been out of meds since at least May 2022 - Blood pressure today is well controlled - near goal  BP Readings from Last 3 Encounters:  12/11/20 130/72  09/14/20 122/82  04/13/20 120/90   Pt denies CP, SOB, exertional sx, LE edema, palpitation, Ha's, visual disturbances, lightheadedness, hypotension, syncope. Dietary efforts for BP?  Trying to be healthier  She is concerned with a few months of difficulty hearing, she can't hear her kids, R>L, hx of cerumen impaction in the past, denies pain/popping sensation of being blocked, chronic allergies managed with OTC antihistamines, no nasal sprays   Patient Active Problem List   Diagnosis Date Noted   Back pain 04/13/2020   Seasonal allergic rhinitis 06/25/2019   Fibroids, submucosal 01/29/2018   Hypertension 12/04/2017   Overweight (BMI 25.0-29.9) 12/04/2017   Migraine 12/04/2017   Insomnia 12/04/2017   Abnormal mammogram 05/14/2013      Current Outpatient Medications:    albuterol (VENTOLIN HFA) 108 (90 Base) MCG/ACT inhaler, Inhale 2 puffs into the lungs every 6 (six) hours as needed for wheezing or shortness of breath., Disp: 18 g, Rfl: 2   clobetasol cream (TEMOVATE) 4.09 %, APPLY 1 APPLICATION TOPICALLY 2 (TWO) TIMES DAILY FOR 30 DAYS THEN USE DAILY AS DIRECTED., Disp: 60 g, Rfl: 0   diclofenac (VOLTAREN) 75 MG EC tablet, Take 75 mg by mouth 2 (two) times daily., Disp: , Rfl:    fluticasone (FLONASE) 50 MCG/ACT nasal spray, Place 2 sprays into both  nostrils daily. (Patient taking differently: Place 2 sprays into both nostrils as needed.), Disp: 16 g, Rfl: 11   levocetirizine (XYZAL) 5 MG tablet, Take 1 tablet (5 mg total) by mouth every evening., Disp: 90 tablet, Rfl: 1   lisinopril (ZESTRIL) 5 MG tablet, Take 1  tablet (5 mg total) by mouth daily., Disp: 90 tablet, Rfl: 3   loratadine (CLARITIN) 10 MG tablet, Take by mouth., Disp: , Rfl:    rizatriptan (MAXALT) 10 MG tablet, Take 1 tablet (10 mg total) by mouth as needed for migraine. May repeat in 2 hours if needed, Disp: 10 tablet, Rfl: 5   cyclobenzaprine (FLEXERIL) 10 MG tablet, Take 10 mg by mouth 3 (three) times daily as needed. (Patient not taking: No sig reported), Disp: , Rfl:    cyclobenzaprine (FLEXERIL) 5 MG tablet, Take 1 tablet (5 mg total) by mouth 2 (two) times daily as needed for muscle spasms., Disp: 30 tablet, Rfl: 0   montelukast (SINGULAIR) 10 MG tablet, Take 1 tablet (10 mg total) by mouth at bedtime., Disp: 90 tablet, Rfl: 3   nortriptyline (PAMELOR) 10 MG capsule, Take 1 capsule (10 mg total) by mouth at bedtime. (Patient not taking: Reported on 12/11/2020), Disp: 90 capsule, Rfl: 1   Allergies  Allergen Reactions   Acetaminophen Itching, Swelling and Other (See Comments)    Also notes BURNING   Shellfish Allergy Itching and Other (See Comments)    Itching of tongue and throat     Social History   Tobacco Use   Smoking status: Never   Smokeless tobacco: Never  Vaping Use   Vaping Use: Never used  Substance Use Topics   Alcohol use: Not Currently   Drug use: Never      Chart Review Today: I personally reviewed active problem list, medication list, allergies, family history, social history, health maintenance, notes from last encounter, lab results, imaging with the patient/caregiver today.   Review of Systems  Constitutional: Negative.   HENT: Negative.    Eyes: Negative.   Respiratory: Negative.    Cardiovascular: Negative.   Gastrointestinal: Negative.   Endocrine: Negative.   Genitourinary: Negative.   Musculoskeletal:  Negative for back pain.  Skin: Negative.   Allergic/Immunologic: Negative.   Neurological: Negative.   Hematological: Negative.   Psychiatric/Behavioral: Negative.    All other  systems reviewed and are negative.     Objective:   Vitals:   12/11/20 1532  BP: 130/72  Pulse: 92  Resp: 16  Temp: 98 F (36.7 C)  TempSrc: Oral  SpO2: 96%  Weight: 177 lb (80.3 kg)  Height: 5\' 6"  (1.676 m)    Body mass index is 28.57 kg/m.  Physical Exam Vitals and nursing note reviewed.  Constitutional:      General: She is not in acute distress.    Appearance: Normal appearance. She is well-developed. She is not ill-appearing, toxic-appearing or diaphoretic.     Interventions: Face mask in place.  HENT:     Head: Normocephalic and atraumatic.     Right Ear: Tympanic membrane, ear canal and external ear normal. No decreased hearing noted. No swelling or tenderness. No middle ear effusion. There is no impacted cerumen. Tympanic membrane is not injected, retracted or bulging.     Left Ear: Tympanic membrane, ear canal and external ear normal. No decreased hearing noted. No swelling or tenderness.  No middle ear effusion. There is no impacted cerumen. Tympanic membrane is not injected, retracted or bulging.  Nose: Mucosal edema, congestion and rhinorrhea present.     Right Turbinates: Enlarged and swollen.     Left Turbinates: Enlarged and swollen.     Mouth/Throat:     Mouth: Mucous membranes are moist.     Pharynx: Oropharynx is clear. Uvula midline.  Eyes:     General: Lids are normal. No scleral icterus.       Right eye: No discharge.        Left eye: No discharge.     Conjunctiva/sclera: Conjunctivae normal.  Neck:     Trachea: Phonation normal. No tracheal deviation.  Cardiovascular:     Rate and Rhythm: Normal rate and regular rhythm.     Pulses: Normal pulses.          Radial pulses are 2+ on the right side and 2+ on the left side.       Posterior tibial pulses are 2+ on the right side and 2+ on the left side.     Heart sounds: Normal heart sounds. No murmur heard.   No friction rub. No gallop.  Pulmonary:     Effort: Pulmonary effort is normal. No  respiratory distress.     Breath sounds: Normal breath sounds. No stridor. No wheezing, rhonchi or rales.  Chest:     Chest wall: No tenderness.  Abdominal:     General: Bowel sounds are normal. There is no distension.     Palpations: Abdomen is soft.  Musculoskeletal:     Right lower leg: No edema.     Left lower leg: No edema.     Comments: No midline tenderness from cervical to lumbar spine, no step off No paraspinal muscle ttp from cervical to lumbar spine  Grossly normal ROM of back   Skin:    General: Skin is warm and dry.     Coloration: Skin is not jaundiced or pale.     Findings: No rash.  Neurological:     Mental Status: She is alert.     Motor: No abnormal muscle tone.     Gait: Gait normal.  Psychiatric:        Mood and Affect: Mood normal.        Speech: Speech normal.        Behavior: Behavior normal.     Results for orders placed or performed in visit on 04/13/20  POCT Urinalysis Dipstick  Result Value Ref Range   Color, UA Yellow    Clarity, UA Clear    Glucose, UA Negative Negative   Bilirubin, UA Negative    Ketones, UA Negative    Spec Grav, UA 1.025 1.010 - 1.025   Blood, UA Negative    pH, UA 6.0 5.0 - 8.0   Protein, UA Positive (A) Negative   Urobilinogen, UA 0.2 0.2 or 1.0 E.U./dL   Nitrite, UA Negative    Leukocytes, UA Negative Negative   Appearance Normal    Odor Normal        Assessment & Plan:     ICD-10-CM   1. Bilateral low back pain without sciatica, unspecified chronicity  M54.50    mostly improved with PT and home exercises, refill on lower dose flexeril, continue healthy habits, f/up if any new/worse or prolonged back pain    2. Decreased hearing, right  H91.91 Ambulatory referral to ENT   over months, R>L troubling hearing her kids, has allergies and enlarged turbinates - wants referral, on allergy meds, no nasal steroids    3. Hypertension,  unspecified type  I10 lisinopril (ZESTRIL) 5 MG tablet    CANCELED: COMPLETE  METABOLIC PANEL WITH GFR   BP near goal off meds for months, resume lisinopril, can try lower dose, continue DASH - overdue for labs, none since 2020    4. Allergic rhinitis, unspecified seasonality, unspecified trigger  J30.9    continue allergy meds and add intranasal steroid spray like flonase - unsure if related to hearing difficulties, doesn't seem like classic ETD    5. Nasal turbinate hypertrophy  J34.3    encouraged her to start daily intranasal steroid spray in addition to allergy meds      Pt had prior labs that had not been completed since 01/2020 CPE - printed labs again and pt will complete CBC lipid and CMP today - will help with HTN and med monitoring    No red flags concerning patient's back pain. No s/s of central cord compression or cauda equina. Lower extremities are neurovascularly intact and patient is ambulating without difficulty.  Encouraged to f/up on HTN and CPE in the next ~3-6 months    Delsa Grana, Hershal Coria 12/11/20 6:54 PM

## 2020-12-12 LAB — CBC WITH DIFFERENTIAL/PLATELET
Absolute Monocytes: 523 cells/uL (ref 200–950)
Basophils Absolute: 70 cells/uL (ref 0–200)
Basophils Relative: 0.9 %
Eosinophils Absolute: 140 cells/uL (ref 15–500)
Eosinophils Relative: 1.8 %
HCT: 40 % (ref 35.0–45.0)
Hemoglobin: 13.3 g/dL (ref 11.7–15.5)
Lymphs Abs: 2465 cells/uL (ref 850–3900)
MCH: 29.2 pg (ref 27.0–33.0)
MCHC: 33.3 g/dL (ref 32.0–36.0)
MCV: 87.9 fL (ref 80.0–100.0)
MPV: 10.7 fL (ref 7.5–12.5)
Monocytes Relative: 6.7 %
Neutro Abs: 4602 cells/uL (ref 1500–7800)
Neutrophils Relative %: 59 %
Platelets: 348 10*3/uL (ref 140–400)
RBC: 4.55 10*6/uL (ref 3.80–5.10)
RDW: 12.2 % (ref 11.0–15.0)
Total Lymphocyte: 31.6 %
WBC: 7.8 10*3/uL (ref 3.8–10.8)

## 2020-12-12 LAB — COMPLETE METABOLIC PANEL WITH GFR
AG Ratio: 1.4 (calc) (ref 1.0–2.5)
ALT: 10 U/L (ref 6–29)
AST: 16 U/L (ref 10–35)
Albumin: 4 g/dL (ref 3.6–5.1)
Alkaline phosphatase (APISO): 66 U/L (ref 31–125)
BUN: 13 mg/dL (ref 7–25)
CO2: 25 mmol/L (ref 20–32)
Calcium: 9.3 mg/dL (ref 8.6–10.2)
Chloride: 108 mmol/L (ref 98–110)
Creat: 0.63 mg/dL (ref 0.50–0.99)
Globulin: 2.9 g/dL (calc) (ref 1.9–3.7)
Glucose, Bld: 81 mg/dL (ref 65–99)
Potassium: 4.6 mmol/L (ref 3.5–5.3)
Sodium: 140 mmol/L (ref 135–146)
Total Bilirubin: 0.3 mg/dL (ref 0.2–1.2)
Total Protein: 6.9 g/dL (ref 6.1–8.1)
eGFR: 109 mL/min/{1.73_m2} (ref >=60)

## 2020-12-12 LAB — LIPID PANEL
Cholesterol: 219 mg/dL — ABNORMAL HIGH (ref <200)
HDL: 64 mg/dL (ref >=50)
LDL Cholesterol (Calc): 136 mg/dL (calc) — ABNORMAL HIGH
Non-HDL Cholesterol (Calc): 155 mg/dL (calc) — ABNORMAL HIGH (ref <130)
Total CHOL/HDL Ratio: 3.4 (calc) (ref <5.0)
Triglycerides: 90 mg/dL (ref <150)

## 2021-01-15 ENCOUNTER — Encounter: Payer: Self-pay | Admitting: Family Medicine

## 2021-02-13 ENCOUNTER — Other Ambulatory Visit: Payer: Self-pay | Admitting: Family Medicine

## 2021-02-13 DIAGNOSIS — G43909 Migraine, unspecified, not intractable, without status migrainosus: Secondary | ICD-10-CM

## 2021-02-13 NOTE — Telephone Encounter (Signed)
Sent pt message via MyChart to call office to make appointment for Maxalt refills.  Will fill for 30 days or #10 pills.   Requested Prescriptions  Pending Prescriptions Disp Refills   rizatriptan (MAXALT) 10 MG tablet [Pharmacy Med Name: RIZATRIPTAN 10 MG TABLET] 10 tablet 5    Sig: TAKE 1 TABLET BY MOUTH AS NEEDED FOR MIGRAINE. MAY REPEAT IN 2 HOURS IF NEEDED     Neurology:  Migraine Therapy - Triptan Passed - 02/13/2021 11:06 AM      Passed - Last BP in normal range    BP Readings from Last 1 Encounters:  12/11/20 130/72          Passed - Valid encounter within last 12 months    Recent Outpatient Visits           2 months ago Bilateral low back pain without sciatica, unspecified chronicity   [facility] Delsa Grana, PA-C   5 months ago Left breast lump   [facility] Delsa Grana, PA-C   10 months ago Urinary frequency   [facility] Myles Gip, DO   1 year ago Adult general medical exam   [facility] Delsa Grana, PA-C   1 year ago Hypertension, unspecified type   [facility] Delsa Grana, [place]

## 2021-02-17 ENCOUNTER — Other Ambulatory Visit: Payer: Self-pay | Admitting: Family Medicine

## 2021-02-17 DIAGNOSIS — G43909 Migraine, unspecified, not intractable, without status migrainosus: Secondary | ICD-10-CM

## 2021-11-08 ENCOUNTER — Encounter: Payer: Self-pay | Admitting: Family Medicine

## 2022-01-26 ENCOUNTER — Emergency Department
Admission: EM | Admit: 2022-01-26 | Discharge: 2022-01-26 | Disposition: A | Discharge status: Home / Self Care | Payer: BC Managed Care – PPO | Attending: Emergency Medicine | Admitting: Emergency Medicine

## 2022-01-26 ENCOUNTER — Other Ambulatory Visit: Payer: Self-pay

## 2022-01-26 DIAGNOSIS — I10 Essential (primary) hypertension: Secondary | ICD-10-CM | POA: Insufficient documentation

## 2022-01-26 DIAGNOSIS — R42 Dizziness and giddiness: Secondary | ICD-10-CM | POA: Insufficient documentation

## 2022-01-26 LAB — CBC
HCT: 41 % (ref 36.0–46.0)
Hemoglobin: 13.4 g/dL (ref 12.0–15.0)
MCH: 28.8 pg (ref 26.0–34.0)
MCHC: 32.7 g/dL (ref 30.0–36.0)
MCV: 88.2 fL (ref 80.0–100.0)
Platelets: 320 10*3/uL (ref 150–400)
RBC: 4.65 MIL/uL (ref 3.87–5.11)
RDW: 12.5 % (ref 11.5–15.5)
WBC: 9.2 10*3/uL (ref 4.0–10.5)
nRBC: 0 % (ref 0.0–0.2)

## 2022-01-26 LAB — POC URINE PREG, ED: Preg Test, Ur: NEGATIVE

## 2022-01-26 LAB — BASIC METABOLIC PANEL
Anion gap: 5 (ref 5–15)
BUN: 10 mg/dL (ref 6–20)
CO2: 21 mmol/L — ABNORMAL LOW (ref 22–32)
Calcium: 9.1 mg/dL (ref 8.9–10.3)
Chloride: 111 mmol/L (ref 98–111)
Creatinine, Ser: 0.54 mg/dL (ref 0.44–1.00)
GFR, Estimated: >60 mL/min (ref >60)
Glucose, Bld: 100 mg/dL — ABNORMAL HIGH (ref 70–99)
Potassium: 4.1 mmol/L (ref 3.5–5.1)
Sodium: 137 mmol/L (ref 135–145)

## 2022-01-26 LAB — URINALYSIS, ROUTINE W REFLEX MICROSCOPIC
Bilirubin Urine: NEGATIVE
Glucose, UA: NEGATIVE mg/dL
Hgb urine dipstick: NEGATIVE
Ketones, ur: 20 mg/dL — AB
Leukocytes,Ua: NEGATIVE
Nitrite: NEGATIVE
Protein, ur: NEGATIVE mg/dL
Specific Gravity, Urine: 1.02 (ref 1.005–1.030)
pH: 7 (ref 5.0–8.0)

## 2022-01-26 MED ORDER — MECLIZINE HCL 25 MG PO TABS: 25.0000 mg | ORAL_TABLET | Freq: Three times a day (TID) | ORAL | 0 refills | Status: DC | PRN

## 2022-01-26 MED ORDER — MECLIZINE HCL 25 MG PO TABS
25.0000 mg | ORAL_TABLET | Freq: Once | ORAL | Status: AC
  Administered 2022-01-26: 25 mg via ORAL
  Filled 2022-01-26: qty 1

## 2022-01-26 MED ORDER — ONDANSETRON 4 MG PO TBDP
4.0000 mg | ORAL_TABLET | Freq: Once | ORAL | Status: AC
  Administered 2022-01-26: 4 mg via ORAL
  Filled 2022-01-26: qty 1

## 2022-01-26 NOTE — Discharge Instructions (Signed)
I suspect your dizziness is due to an inner ear problem.  However return to the emergency department if you develop blurred or double vision numbness or weakness on one side your body or unable to speak.

## 2022-01-26 NOTE — ED Notes (Signed)
Pt walked back to treatment room. Pt walking with steady gait. See triage note, pt came to ED for dizziness and "room spinning" since last night.

## 2022-01-26 NOTE — ED Provider Notes (Signed)
[facility] Provider Note    Event Date/Time   First MD Initiated Contact with Patient 01/26/22 [phone]     (approximate)   History   Dizziness   HPI  Shirley Harris is a 51 y.o. female past medical history of hypertension who presents with dizziness.  Symptoms started last night around 11 PM.  Describes feeling unsteady.  Denies feeling like she is going to pass out or any movement sensation.  She has had mild headache associated with it.  Has vomited several times.  When she is sitting still.  Feels worse when she moves around and when she stands up.  Ever when she starts walking symptoms feel better.  She is still able to ambulate.  Denies diplopia vision change numbness tingling weakness.  Had similar episode several years ago.  Did have some ringing in her ears earlier this is resolved.  Denies hearing loss.  No chest pain or dyspnea     Past Medical History:  Diagnosis Date   Chronic tension-type headache, not intractable 12/02/2016   Hypertension     Patient Active Problem List   Diagnosis Date Noted   Back pain 04/13/2020   Seasonal allergic rhinitis 06/25/2019   Fibroids, submucosal 01/29/2018   Hypertension 12/04/2017   Overweight (BMI 25.0-29.9) 12/04/2017   Migraine 12/04/2017   Insomnia 12/04/2017   Abnormal mammogram 05/14/2013     Physical Exam  Triage Vital Signs: ED Triage Vitals  Enc Vitals Group     BP 01/26/22 0930 (!) 148/98     Pulse Rate 01/26/22 0930 85     Resp 01/26/22 0930 18     Temp 01/26/22 0930 98 F (36.7 C)     Temp src --      SpO2 01/26/22 0930 97 %     Weight --      Height --      Head Circumference --      Peak Flow --      Pain Score 01/26/22 0929 4     Pain Loc --      Pain Edu? --      Excl. in [place]? --     Most recent vital signs: Vitals:   01/26/22 0930 01/26/22 1220  BP: (!) 148/98 (!) 140/94  Pulse: 85 72  Resp: 18 16  Temp: 98 F (36.7 C) 98.4 F (36.9 C)  SpO2: 97% 97%      General: Awake, no distress.  CV:  Good peripheral perfusion.  Resp:  Normal effort.  Abd:  No distention.  Neuro:             Awake, Alert, Oriented x 3  Other:  Aox3, nml speech  PERRL, EOMI, face symmetric, nml tongue movement  5/5 strength in the BL upper and lower extremities  Sensation grossly intact in the BL upper and lower extremities  Finger-nose-finger intact BL Patient's gait is overall normal, she does stumble once or twice but then after she catches her balance she walks without ataxia    ED Results / Procedures / Treatments  Labs (all labs ordered are listed, but only abnormal results are displayed) Labs Reviewed  BASIC METABOLIC PANEL - Abnormal; Notable for the following components:      Result Value   CO2 21 (*)    Glucose, Bld 100 (*)    All other components within normal limits  URINALYSIS, ROUTINE W REFLEX MICROSCOPIC - Abnormal; Notable for the following components:   Color, Urine YELLOW (*)  APPearance CLEAR (*)    Ketones, ur 20 (*)    All other components within normal limits  CBC  CBG MONITORING, ED  POC URINE PREG, ED     EKG  EKG interpretation performed by myself: NSR, nml axis, nml intervals, no acute ischemic changes    RADIOLOGY    PROCEDURES:  Critical Care performed: No  Procedures     MEDICATIONS ORDERED IN ED: Medications  meclizine (ANTIVERT) tablet 25 mg (25 mg Oral Given 01/26/22 1129)  ondansetron (ZOFRAN-ODT) disintegrating tablet 4 mg (4 mg Oral Given 01/26/22 1129)     IMPRESSION / MDM / ASSESSMENT AND PLAN / ED COURSE  I reviewed the triage vital signs and the nursing notes.                              Patient's presentation is most consistent with acute complicated illness / injury requiring diagnostic workup.  Differential diagnosis includes, but is not limited to, BPPV, vestibular neuritis, posterior stroke, orthostatic hypotension, anemia, dehydration, arrhythmia  The patient is a  51 year old female presents with vague dizziness since last night.  Denies frank vertiginous symptoms just feels somewhat unsteady.  Has been coming and going.  There is really no associated neurologic symptoms including no diplopia dysarthria numbness tingling weakness.  Patient is mildly hypertensive.  Vitals otherwise reassuring.  She looks well neurologic exam is nonfocal.  No concerning nystagmus finger-nose-finger intact.  I walked the patient initially she stumbled once but then caught her balance and was able to ambulate without ataxia for significant distance.  Clinical suspicion for central process at this time.  I suspect BPPV versus orthostatic hypotension.  Plan to give meclizine Zofran and reassess.  Green for anemia or electrolyte abnormality of also been sent.   Patient's CBC is reassuring.  BMP also normal.  UA is clean.  Patient improved after meclizine and Zofran.  I observe her ambulate without any difficulty.  We discussed return to ED.  Will give prescription for meclizine.     FINAL CLINICAL IMPRESSION(S) / ED DIAGNOSES   Final diagnoses:  Dizziness     Rx / DC Orders   ED Discharge Orders          Ordered    meclizine (ANTIVERT) 25 MG tablet  3 times daily PRN        01/26/22 1208             Note:  This document was prepared using Dragon voice recognition software and may include unintentional dictation errors.   Rada Hay, MD 01/26/22 1538

## 2022-01-26 NOTE — ED Triage Notes (Addendum)
Pt comes with c/o dizziness. Pt states this all started last night. Pt states she feels like the world is spinning, headache and lightheaded.  Pt states some dry heaves this am. Pt denies any numbness or tingling or blurry vision.  Pt states she does take BP meds but hasn't been taking them.

## 2022-01-26 NOTE — ED Notes (Signed)
EDP at bedside and walking pt in hallway.

## 2022-08-25 IMAGING — US US BREAST*L* LIMITED INC AXILLA
1 series · 10 of 10 positions shown · non-contrast
Comparison: Previous exam(s).

CLINICAL DATA: 49-year-old female with palpable LEFT breast mass
identified on self-examination, also for annual bilateral mammogram.

EXAM:
DIGITAL DIAGNOSTIC BILATERAL MAMMOGRAM WITH TOMOSYNTHESIS AND CAD;
ULTRASOUND LEFT BREAST LIMITED
TECHNIQUE: Bilateral digital diagnostic mammography and breast tomosynthesis
was performed. The images were evaluated with computer-aided
detection.; Targeted ultrasound examination of the left breast was
performed.

[Series 1: us breast*left* limited inc axilla · 0.07mm/px · 10 of 10 slices shown]
[im 1/10]
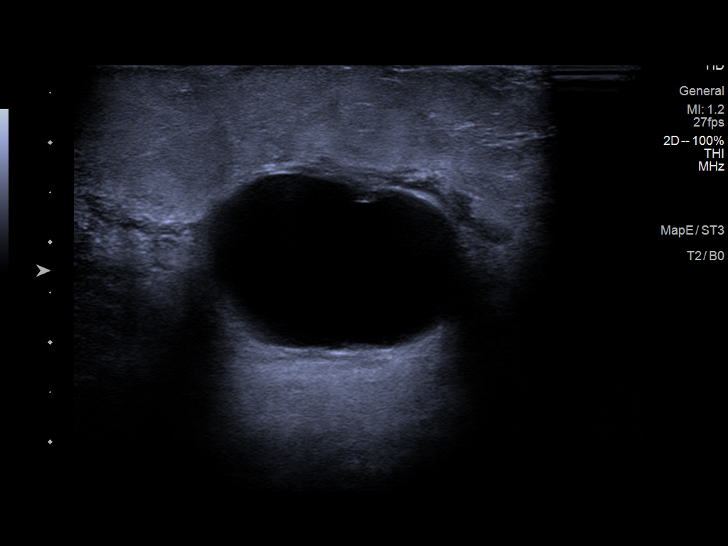
[im 2/10]
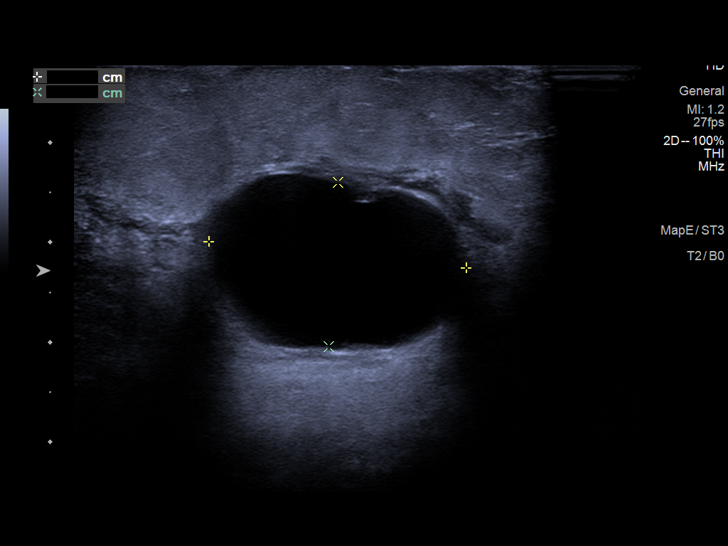
[im 3/10]
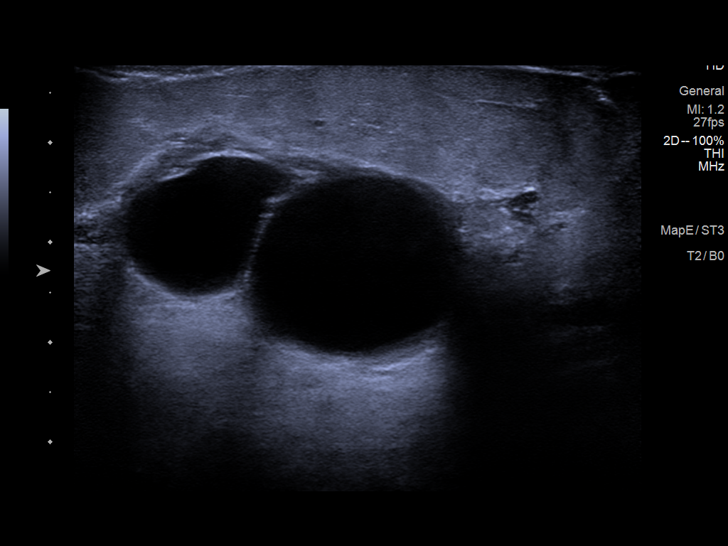
[im 4/10]
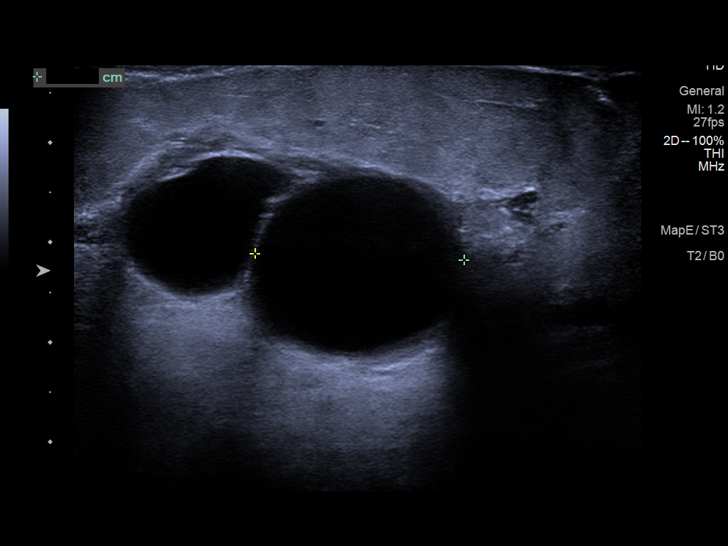
[im 5/10]
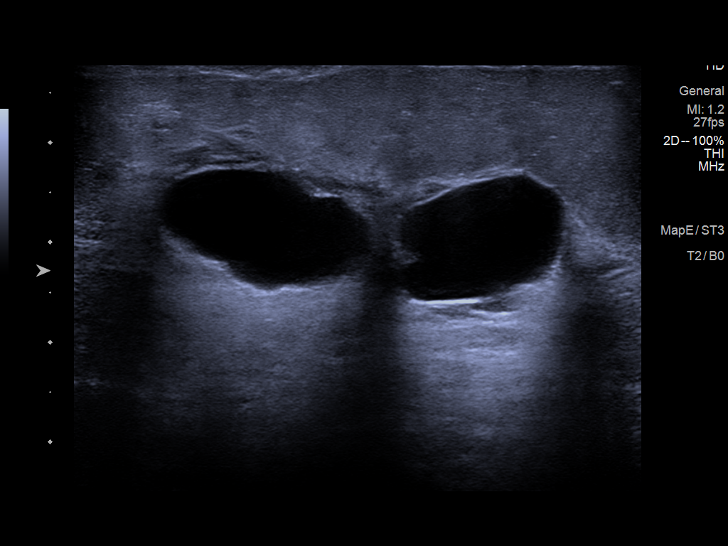
[im 6/10]
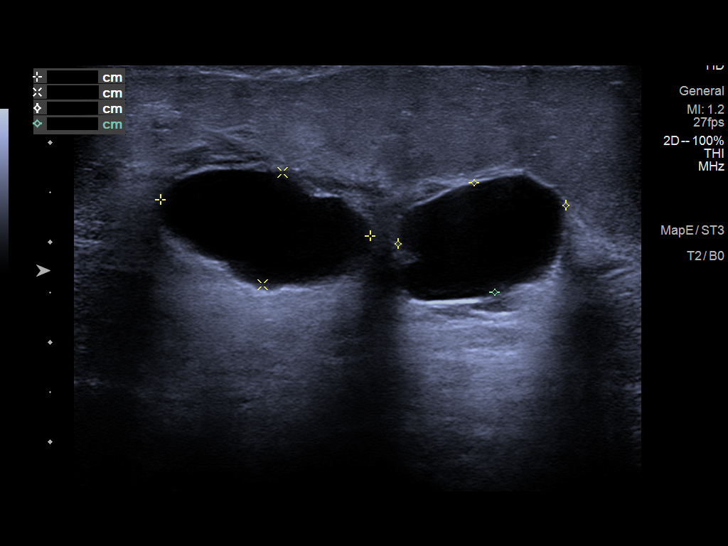
[im 7/10]
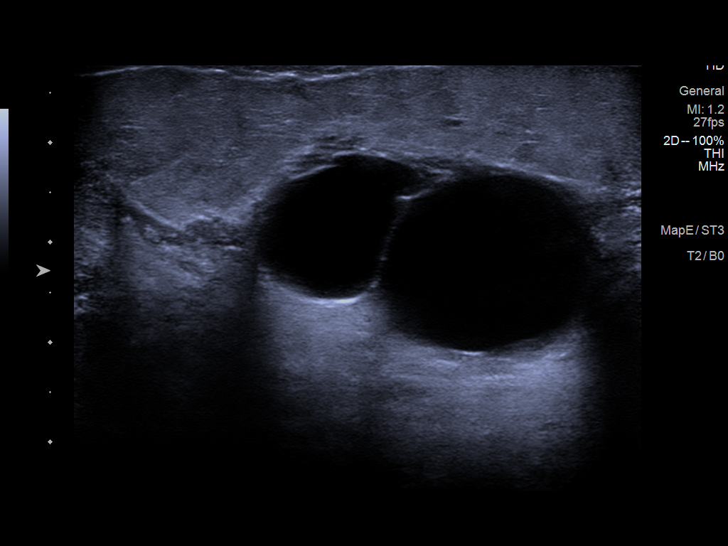
[im 8/10]
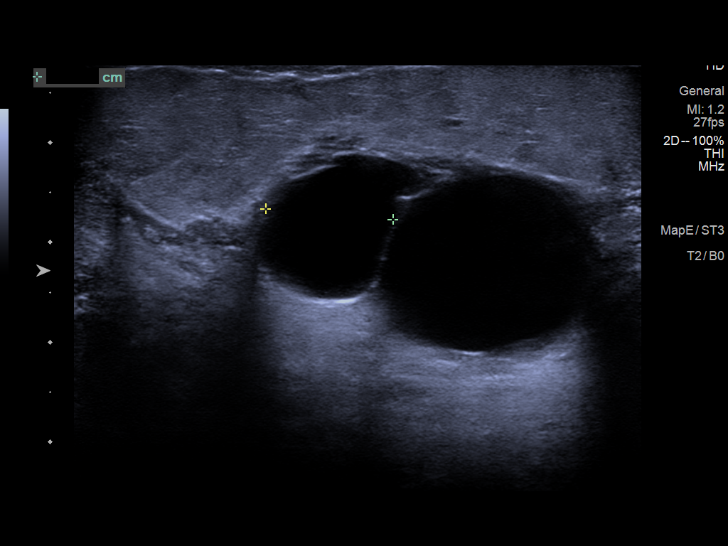
[im 9/10]
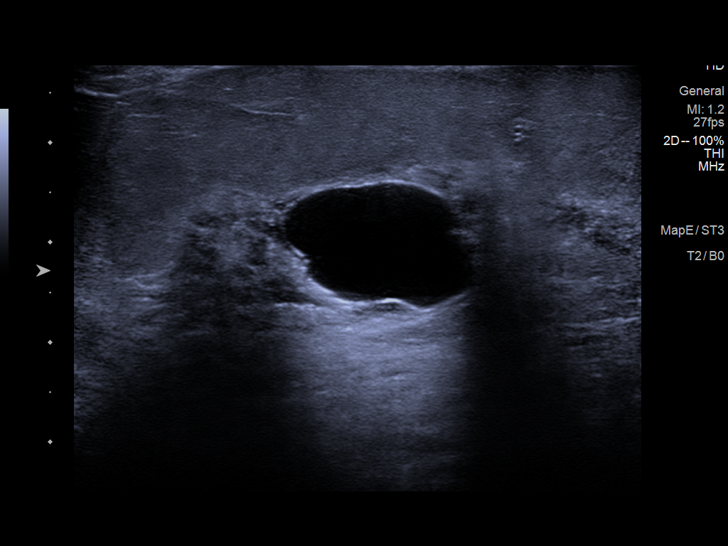
[im 10/10]
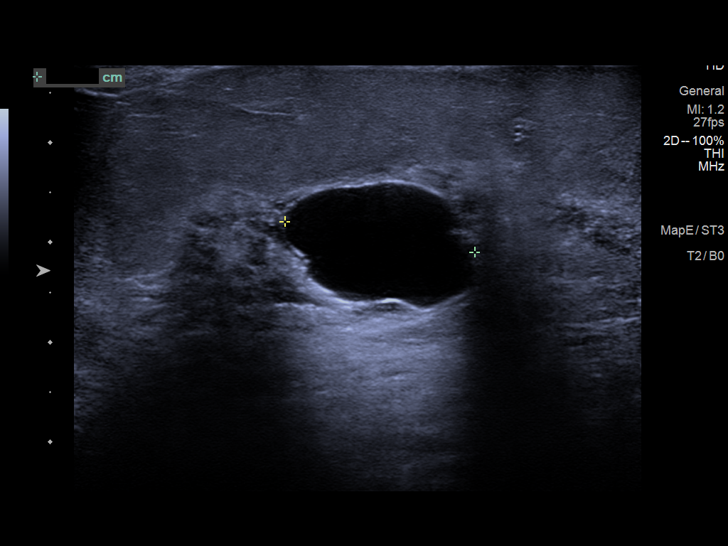

[10 of 10 positions shown; findings below may reference images not displayed]

ACR Breast Density Category c: The breast tissue is heterogeneously
dense, which may obscure small masses.
FINDINGS: 2D/3D full field views of both breasts and spot compression view of
the LEFT breast demonstrate a circumscribed oval mass within the
anterior UPPER LEFT breast.

No suspicious findings are noted within the RIGHT breast.

On physical exam, a firm palpable mass at the 12 o'clock position of
the LEFT breast 2-3 cm from the nipple is identified.

Targeted ultrasound is performed, showing 3 adjacent simple cysts at
the 12 o'clock position of the LEFT breast 2-3 cm from the nipple,
encompassing an area measuring 1.2 x 4 cm. These benign cysts
correspond to the patient's palpable abnormality.
IMPRESSION: 1. Benign cysts within the UPPER LEFT breast corresponding to the
patient's palpable abnormality.
2. No evidence of breast malignancy.

RECOMMENDATION:
Bilateral screening mammogram in 1 year.

I have discussed the findings and recommendations with the patient.
If applicable, a reminder letter will be sent to the patient
regarding the next appointment.

BI-RADS CATEGORY  2: Benign.

## 2022-10-25 ENCOUNTER — Telehealth: Payer: Self-pay | Admitting: Family Medicine

## 2022-10-25 NOTE — Telephone Encounter (Signed)
Are you able to order the lab? Pt has not been seen since 2022.

## 2022-10-25 NOTE — Telephone Encounter (Signed)
Copied from CRM [id number]. Topic: General - Other >> Oct 25, 2022 10:25 AM Franchot Heidelberg wrote: Reason for CRM: Pt's daughter has tested positive for Lupus and the results show that she has had this since birth. Pt wants to know if she can be tested to see if she has it and if she has passed it to her other children  Best contact: [phone]

## 2022-10-27 NOTE — Telephone Encounter (Signed)
Please schedule

## 2022-10-27 NOTE — Telephone Encounter (Signed)
Pt will call us back when she is available to do so to schedule. She is currently working and is unable to talk  at the moment.

## 2022-11-18 ENCOUNTER — Ambulatory Visit (INDEPENDENT_AMBULATORY_CARE_PROVIDER_SITE_OTHER): Payer: BC Managed Care – PPO | Admitting: Physician Assistant

## 2022-11-18 ENCOUNTER — Ambulatory Visit
Admission: RE | Admit: 2022-11-18 | Discharge: 2022-11-18 | Disposition: A | Discharge status: Home / Self Care | Payer: BC Managed Care – PPO | Source: Ambulatory Visit | Attending: Physician Assistant | Admitting: Physician Assistant

## 2022-11-18 ENCOUNTER — Other Ambulatory Visit: Payer: Self-pay | Admitting: Physician Assistant

## 2022-11-18 ENCOUNTER — Ambulatory Visit: Payer: BC Managed Care – PPO | Admitting: Family Medicine

## 2022-11-18 ENCOUNTER — Encounter: Payer: Self-pay | Admitting: Physician Assistant

## 2022-11-18 VITALS — BP 144/90 | HR 91 | Temp 97.8°F | Resp 16 | Ht 66.0 in | Wt 166.6 lb

## 2022-11-18 DIAGNOSIS — J4520 Mild intermittent asthma, uncomplicated: Secondary | ICD-10-CM | POA: Diagnosis not present

## 2022-11-18 DIAGNOSIS — R1012 Left upper quadrant pain: Secondary | ICD-10-CM

## 2022-11-18 DIAGNOSIS — R1011 Right upper quadrant pain: Secondary | ICD-10-CM

## 2022-11-18 DIAGNOSIS — I10 Essential (primary) hypertension: Secondary | ICD-10-CM | POA: Diagnosis not present

## 2022-11-18 DIAGNOSIS — J45909 Unspecified asthma, uncomplicated: Secondary | ICD-10-CM | POA: Insufficient documentation

## 2022-11-18 MED ORDER — ALBUTEROL SULFATE HFA 108 (90 BASE) MCG/ACT IN AERS: 2.0000 | INHALATION_SPRAY | Freq: Four times a day (QID) | RESPIRATORY_TRACT | 2 refills | Status: AC | PRN

## 2022-11-18 MED ORDER — LISINOPRIL 5 MG PO TABS: 5.0000 mg | ORAL_TABLET | Freq: Every day | ORAL | 1 refills | Status: DC

## 2022-11-18 NOTE — Patient Instructions (Addendum)
I recommend taking a medication called Pepcid to help with potential reflux or stomach acid   I have placed orders for you to get imaging done of your ribs and an ultrasound of your gallbladder,liver and pancreas. You should receive a call in the next 1-2 weeks to set this up. If you have not been contacted in 2 weeks please call our office  Please go here for your  xray   [address] [place] [address] [place], [place] [address]    Your blood pressure was mildly elevated today.  If possible please take it at home using an electronic blood pressure cuff for the upper arm Record your blood pressure once per day and bring them back with you to your apt so we can make sure you are not developing high blood pressure.   Incorporating a minimum of 150 minutes (20-30 minutes per day) of moderate intensity physical activity can help improve your heart health and reduce the chances of high blood pressure and other cardiovascular risks. Incorporating a heart healthy diet can also help reduce the chances of heart attack and high cholesterol.  It was nice to meet you and I appreciate the opportunity to be involved in your care If you were satisfied with the care you received from me, I would greatly appreciate you saying so in the after-visit survey that is sent out following our visit.

## 2022-11-18 NOTE — Assessment & Plan Note (Signed)
Chronic, historic condition Appears exacerbated today due to lack of medication for several months Will send in prescription for lisinopril 5 mg p.o. daily. Recommend that she checks blood pressure at home for monitoring and keep a log to review at follow-up Follow-up in 4 weeks or sooner if concerns arise

## 2022-11-18 NOTE — Progress Notes (Signed)
Acute Office Visit   Patient: Shirley Harris   DOB: 06-05-1970   52 y.o. Female  MRN: [id number] Visit Date: 11/18/2022  Today's healthcare provider: Oswaldo Conroy Tyshay Adee, PA-C  Introduced myself to the patient as a Secondary school teacher and provided education on APPs in clinical practice.    Chief Complaint  Patient presents with   Abdominal Pain    Near ribs bilateral sides, unable to sleep on either side onset for a while   Subjective    HPI HPI     Abdominal Pain    Additional comments: Near ribs bilateral sides, unable to sleep on either side onset for a while      Last edited by Forde Radon, CMA on 11/18/2022  3:06 PM.        Abdominal pain   Onset: gradual  Duration: ongoing for several months  She denies trauma or injuries to the area  Location: upper abdominal pain/ lower rib pain - tender to light palpation  Radiation:none  Pain level and character: 7/10 without intervention Other associated symptoms: she denies rashes or bruising along the abdomen or sides  Interventions: muscle rub, warm compresses, sometimes takes Ibuprofen  Alleviating: Heat and muscle rub  Aggravating: laying on either side, flares at night  She sometimes has the sensation of SOB with the pain but is able to take a breath  She is not sure if it gets worse with PO Intake  She has not used inhaler since having pain    Hypertension: - Medications: Lisinopril - she has been out for several months  - Compliance: good when she has it  - Checking BP at home: tries to check a few times a month - she is not sure of results      Medications: Outpatient Medications Prior to Visit  Medication Sig   fluticasone (FLONASE) 50 MCG/ACT nasal spray Place 2 sprays into both nostrils daily. (Patient not taking: Reported on 11/18/2022)   levocetirizine (XYZAL) 5 MG tablet Take 1 tablet (5 mg total) by mouth every evening. (Patient not taking: Reported on 11/18/2022)   loratadine (CLARITIN) 10 MG  tablet Take by mouth. (Patient not taking: Reported on 11/18/2022)   montelukast (SINGULAIR) 10 MG tablet Take 1 tablet (10 mg total) by mouth at bedtime.   [DISCONTINUED] albuterol (VENTOLIN HFA) 108 (90 Base) MCG/ACT inhaler Inhale 2 puffs into the lungs every 6 (six) hours as needed for wheezing or shortness of breath. (Patient not taking: Reported on 11/18/2022)   [DISCONTINUED] clobetasol cream (TEMOVATE) 0.05 % APPLY 1 APPLICATION TOPICALLY 2 (TWO) TIMES DAILY FOR 30 DAYS THEN USE DAILY AS DIRECTED. (Patient not taking: Reported on 11/18/2022)   [DISCONTINUED] cyclobenzaprine (FLEXERIL) 10 MG tablet Take 10 mg by mouth 3 (three) times daily as needed. (Patient not taking: Reported on 11/18/2022)   [DISCONTINUED] cyclobenzaprine (FLEXERIL) 5 MG tablet Take 1 tablet (5 mg total) by mouth 2 (two) times daily as needed for muscle spasms. (Patient not taking: Reported on 11/18/2022)   [DISCONTINUED] diclofenac (VOLTAREN) 75 MG EC tablet Take 75 mg by mouth 2 (two) times daily. (Patient not taking: Reported on 11/18/2022)   [DISCONTINUED] lisinopril (ZESTRIL) 5 MG tablet Take 1 tablet (5 mg total) by mouth daily. (Patient not taking: Reported on 11/18/2022)   [DISCONTINUED] meclizine (ANTIVERT) 25 MG tablet Take 1 tablet (25 mg total) by mouth 3 (three) times daily as needed for dizziness. (Patient not taking: Reported on 11/18/2022)   [DISCONTINUED] nortriptyline (  PAMELOR) 10 MG capsule Take 1 capsule (10 mg total) by mouth at bedtime. (Patient not taking: Reported on 11/18/2022)   [DISCONTINUED] rizatriptan (MAXALT) 10 MG tablet TAKE 1 TABLET BY MOUTH AS NEEDED FOR MIGRAINE. MAY REPEAT IN 2 HOURS IF NEEDED (Patient not taking: Reported on 11/18/2022)   No facility-administered medications prior to visit.    Review of Systems  Constitutional:  Positive for diaphoresis (sweats a lot at night). Negative for chills, fatigue and fever.  Respiratory:  Negative for chest tightness, shortness of breath  and wheezing.   Gastrointestinal:  Positive for abdominal pain. Negative for constipation, diarrhea, nausea and vomiting.        Objective    BP (!) 144/90   Pulse 91   Temp 97.8 F (36.6 C) (Oral)   Resp 16   Ht 5\' 6"  (1.676 m)   Wt 166 lb 9.6 oz (75.6 kg)   LMP 12/05/2017 (Exact Date)   SpO2 98%   BMI 26.89 kg/m     Physical Exam Vitals reviewed.  Constitutional:      General: She is awake.     Appearance: Normal appearance. She is well-developed and well-groomed.  HENT:     Head: Normocephalic and atraumatic.  Cardiovascular:     Rate and Rhythm: Normal rate and regular rhythm.     Pulses: Normal pulses.          Radial pulses are 2+ on the right side and 2+ on the left side.     Heart sounds: Normal heart sounds. No murmur heard.    No friction rub. No gallop.  Pulmonary:     Effort: Pulmonary effort is normal.     Breath sounds: Normal breath sounds. No decreased air movement. No decreased breath sounds, wheezing, rhonchi or rales.  Abdominal:     General: Abdomen is flat. Bowel sounds are normal.     Palpations: Abdomen is soft. There is no hepatomegaly or splenomegaly.     Tenderness: There is abdominal tenderness in the right upper quadrant and left upper quadrant. There is no guarding. Positive signs include Murphy's sign.     Comments: Pain along lower edges of ribs bilaterally.  Notable Murphy sign during palpation of liver.  Musculoskeletal:     Cervical back: Normal range of motion.     Right lower leg: No edema.     Left lower leg: No edema.  Neurological:     General: No focal deficit present.     Mental Status: She is alert and oriented to person, place, and time.  Psychiatric:        Mood and Affect: Mood normal.        Behavior: Behavior normal. Behavior is cooperative.        Thought Content: Thought content normal.        Judgment: Judgment normal.       No results found for any visits on 11/18/22.  Assessment & Plan      Return in  about 4 weeks (around 12/16/2022) for HTN, chronic follow up .      Problem List Items Addressed This Visit       Cardiovascular and Mediastinum   Hypertension    Chronic, historic condition Appears exacerbated today due to lack of medication for several months Will send in prescription for lisinopril 5 mg p.o. daily. Recommend that she checks blood pressure at home for monitoring and keep a log to review at follow-up Follow-up in 4 weeks or sooner if  concerns arise      Relevant Medications   lisinopril (ZESTRIL) 5 MG tablet     Respiratory   Reactive airway disease    Chronic, likely intermittent Patient has not had albuterol inhaler and is requesting refill today Physical exam is overall reassuring Inhaler refill provided today  we will follow-up in 4 weeks or sooner if concerns arise      Relevant Medications   albuterol (VENTOLIN HFA) 108 (90 Base) MCG/ACT inhaler   Other Visit Diagnoses     RUQ pain    -  Primary Chronic per patient, ongoing Patient reports bilateral upper abdominal pain for several months that seems to respond mildly to muscle rub She reports the pain is worse at night and area is tender to palpation Physical exam was concerning for positive Murphy sign during liver palpation.  No signs of hepatomegaly Differential includes but is not limited to the following: Cholelithiasis, pancreatitis, GERD, costochondritis, constipation, muscle strain Will order bilateral rib x-rays as well as right upper quadrant ultrasound for rule out Recommend alternating Tylenol and ibuprofen as needed for pain management Recommend starting Pepcid once daily to assist with potential GERD Results of imaging to dictate further management.  If needed will order lipase, amylase, CMP at follow-up in 4 weeks for further workup Reviewed signs and symptoms necessitating emergency room visit or early follow-up Follow-up as needed for progressing or persistent symptoms   Relevant  Orders   US Abdomen Limited RUQ (LIVER/GB)   LUQ pain            Return in about 4 weeks (around 12/16/2022) for HTN, chronic follow up .   I, Sabreen Kitchen E Tony Friscia, PA-C, have reviewed all documentation for this visit. The documentation on 11/18/22 for the exam, diagnosis, procedures, and orders are all accurate and complete.   Jacquelin Hawking, MHS, PA-C [facility]

## 2022-11-18 NOTE — Assessment & Plan Note (Signed)
Chronic, likely intermittent Patient has not had albuterol inhaler and is requesting refill today Physical exam is overall reassuring Inhaler refill provided today  we will follow-up in 4 weeks or sooner if concerns arise

## 2022-12-05 ENCOUNTER — Telehealth: Payer: Self-pay | Admitting: Family Medicine

## 2022-12-05 NOTE — Telephone Encounter (Signed)
Called pt back and advised we have not contacted her due to radiology team has not reviewed her imaging. Pt states is un upsetting having to wait so much time. I advised if she has any worsening sx to seek ER attention but on our behalf we are just waiting for them to review so we can call her with results.

## 2022-12-05 NOTE — Telephone Encounter (Signed)
Pt is calling for imaging results DG Ribs Bilateral W/Chest (Accession [id number]) (Order [id number])

## 2022-12-07 ENCOUNTER — Ambulatory Visit
Admission: RE | Admit: 2022-12-07 | Discharge: 2022-12-07 | Disposition: A | Discharge status: Home / Self Care | Payer: BC Managed Care – PPO | Source: Ambulatory Visit | Attending: Physician Assistant | Admitting: Physician Assistant

## 2022-12-07 DIAGNOSIS — R1011 Right upper quadrant pain: Secondary | ICD-10-CM

## 2022-12-11 ENCOUNTER — Other Ambulatory Visit: Payer: Self-pay | Admitting: Physician Assistant

## 2022-12-11 DIAGNOSIS — I10 Essential (primary) hypertension: Secondary | ICD-10-CM

## 2022-12-12 NOTE — Progress Notes (Signed)
Your rib imaging was normal- no evidence of fracture or lesions. No cardiopulmonary issues identified either.

## 2022-12-12 NOTE — Progress Notes (Signed)
Your abdominal ultrasound was normal- no signs of stones in the gallbladder or inflammation or blockage.Your  Liver also looked normal on imaging.

## 2022-12-13 NOTE — Telephone Encounter (Signed)
Requested medication (s) are due for refill today: yes   Requested medication (s) are on the active medication list: yes   Last refill:  11/18/22 #30 1 refills  Future visit scheduled: yes tomorrow  Notes to clinic:  protocol failed last labs 01/26/22 .Pharmacy comment: REQUEST FOR 90 DAYS PRESCRIPTION. DX Code Needed.        Requested Prescriptions  Pending Prescriptions Disp Refills   lisinopril (ZESTRIL) 5 MG tablet [Pharmacy Med Name: LISINOPRIL 5 MG TABLET] 90 tablet 1    Sig: Take 1 tablet (5 mg total) by mouth daily.     Cardiovascular:  ACE Inhibitors Failed - 12/11/2022 12:32 PM      Failed - Cr in normal range and within 180 days    Creat  Date Value Ref Range Status  12/11/2020 0.63 0.50 - 0.99 mg/dL Final   Creatinine, Ser  Date Value Ref Range Status  01/26/2022 0.54 0.44 - 1.00 mg/dL Final         Failed - K in normal range and within 180 days    Potassium  Date Value Ref Range Status  01/26/2022 4.1 3.5 - 5.1 mmol/L Final         Failed - Last BP in normal range    BP Readings from Last 1 Encounters:  11/18/22 (!) 144/90         Passed - Patient is not pregnant      Passed - Valid encounter within last 6 months    Recent Outpatient Visits           3 weeks ago RUQ pain   [facility] Mecum, Oswaldo Conroy, PA-C   2 years ago Bilateral low back pain without sciatica, unspecified chronicity   [facility] Danelle Berry, PA-C   2 years ago Left breast lump   [facility] Danelle Berry, PA-C   2 years ago Urinary frequency   [facility] Caro Laroche, DO   2 years ago Adult general medical exam   [facility] Danelle Berry, PA-C       Future Appointments             Tomorrow Mecum, Oswaldo Conroy, PA-C [facility]

## 2022-12-14 ENCOUNTER — Ambulatory Visit: Payer: BC Managed Care – PPO | Admitting: Physician Assistant

## 2022-12-15 ENCOUNTER — Encounter: Payer: Self-pay | Admitting: Physician Assistant

## 2022-12-15 ENCOUNTER — Ambulatory Visit (INDEPENDENT_AMBULATORY_CARE_PROVIDER_SITE_OTHER): Payer: BC Managed Care – PPO | Admitting: Physician Assistant

## 2022-12-15 VITALS — BP 132/90 | HR 86 | Resp 16 | Ht 66.0 in | Wt 164.7 lb

## 2022-12-15 DIAGNOSIS — Z1231 Encounter for screening mammogram for malignant neoplasm of breast: Secondary | ICD-10-CM | POA: Diagnosis not present

## 2022-12-15 DIAGNOSIS — R1011 Right upper quadrant pain: Secondary | ICD-10-CM | POA: Insufficient documentation

## 2022-12-15 DIAGNOSIS — I10 Essential (primary) hypertension: Secondary | ICD-10-CM

## 2022-12-15 MED ORDER — LISINOPRIL 10 MG PO TABS: 10.0000 mg | ORAL_TABLET | Freq: Every day | ORAL | 0 refills | Status: DC

## 2022-12-15 NOTE — Assessment & Plan Note (Signed)
Acute, ongoing She reports persistent upper quadrant pain across the right and left sides We reviewed the results of her recent rib x-ray and right upper quadrant ultrasound-both negative, normal Today we will check hepatic function panel, amylase, lipase, CMP, CBC for further rule out We also discussed using different bras as she is concerned that the weight of her breast is likely contributing to this discomfort Results of labs to dictate further management Follow-up as needed for progressing or persistent symptoms

## 2022-12-15 NOTE — Patient Instructions (Signed)
  I also recommend adding an antihistamine to your daily regimen  This includes medications like Claritin, Allegra, Zyrtec- the generics of these work very well and are usually less expensive  I recommend using Flonase nasal spray - 2 puffs twice per day to help with your nasal congestion The antihistamines and Flonase can take a few weeks to provide significant relief from allergy symptoms but should start to provide some benefit soon.  

## 2022-12-15 NOTE — Assessment & Plan Note (Signed)
Chronic, historic condition Appears exacerbated today despite taking her lisinopril 5 mg p.o. daily Will increase to lisinopril 10 mg p.o. daily for further control Recommend that she keeps checking blood pressure at home and records to review at follow-up Follow-up in 4 weeks or sooner if concerns arise

## 2022-12-15 NOTE — Progress Notes (Signed)
Established Patient Office Visit  Name: Shirley Harris   MRN: [id number]    DOB: 1970/03/24   Date:12/15/2022  Today's Provider: Jacquelin Hawking, MHS, PA-C Introduced myself to the patient as a PA-C and provided education on APPs in clinical practice.         Subjective  Chief Complaint  Chief Complaint  Patient presents with   Hypertension    HPI  Hypertension: - Compliance: Lisinopril 5 mg PO every day - she has been taking as directed  - Checking BP at home: not checking  She reports having more headaches lately, she reports increased stress at work  She reports some soreness and cramping in her calves- she has been walking more frequently the past few weeks, denies swelling or claudication   She reports she is still having soreness along her upper abdomen Imaging results were reviewed with her today   She reports feeling like her ears are clogged    Patient Active Problem List   Diagnosis Date Noted   RUQ pain 12/15/2022   Reactive airway disease 11/18/2022   Back pain 04/13/2020   Seasonal allergic rhinitis 06/25/2019   Fibroids, submucosal 01/29/2018   Hypertension 12/04/2017   Overweight (BMI 25.0-29.9) 12/04/2017   Migraine 12/04/2017   Insomnia 12/04/2017   Abnormal mammogram 05/14/2013    Past Surgical History:  Procedure Laterality Date   ABDOMINAL HYSTERECTOMY     BREAST BIOPSY Right 2015   Benign breast tissue with cystic and papillary apically metaplasia   CESAREAN SECTION     COLONOSCOPY WITH PROPOFOL N/A 02/21/2019   Procedure: COLONOSCOPY WITH PROPOFOL;  Surgeon: Toney Reil, MD;  Location: [facility] ENDOSCOPY;  Service: Gastroenterology;  Laterality: N/A;   LAPAROSCOPIC VAGINAL HYSTERECTOMY WITH SALPINGECTOMY Bilateral 01/29/2018   Procedure: LAPAROSCOPIC ASSISTED VAGINAL HYSTERECTOMY WITH BILATERAL SALPINGECTOMY;  Surgeon: Linzie Collin, MD;  Location: [facility] ORS;  Service: Gynecology;  Laterality: Bilateral;   TUBAL LIGATION  2010     Family History  Problem Relation Age of Onset   Stroke Mother    Multiple sclerosis Father    Breast cancer Maternal Aunt    Heart attack Maternal Grandfather     Social History   Tobacco Use   Smoking status: Never   Smokeless tobacco: Never  Substance Use Topics   Alcohol use: Not Currently     Current Outpatient Medications:    albuterol (VENTOLIN HFA) 108 (90 Base) MCG/ACT inhaler, Inhale 2 puffs into the lungs every 6 (six) hours as needed for wheezing or shortness of breath., Disp: 18 g, Rfl: 2   fluticasone (FLONASE) 50 MCG/ACT nasal spray, Place 2 sprays into both nostrils daily. (Patient not taking: Reported on 11/18/2022), Disp: 16 g, Rfl: 11   levocetirizine (XYZAL) 5 MG tablet, Take 1 tablet (5 mg total) by mouth every evening. (Patient not taking: Reported on 11/18/2022), Disp: 90 tablet, Rfl: 1   lisinopril (ZESTRIL) 10 MG tablet, Take 1 tablet (10 mg total) by mouth daily., Disp: 90 tablet, Rfl: 0   loratadine (CLARITIN) 10 MG tablet, Take by mouth. (Patient not taking: Reported on 11/18/2022), Disp: , Rfl:    montelukast (SINGULAIR) 10 MG tablet, Take 1 tablet (10 mg total) by mouth at bedtime., Disp: 90 tablet, Rfl: 3  Allergies  Allergen Reactions   Acetaminophen Itching, Swelling and Other (See Comments)    Also notes BURNING   Shellfish Allergy Itching and Other (See Comments)    Itching of tongue and  throat    I personally reviewed active problem list, medication list, notes from last encounter with the patient/caregiver today.   Review of Systems  Eyes:  Negative for blurred vision, double vision and photophobia.  Respiratory:  Negative for shortness of breath.   Cardiovascular:  Negative for chest pain, palpitations and leg swelling.  Neurological:  Positive for headaches. Negative for dizziness.      Objective  Vitals:   12/15/22 0951 12/15/22 1016  BP: (!) 140/96 (!) 132/90  Pulse: 86   Resp: 16   SpO2: 97%   Weight: 164 lb 11.2 oz  (74.7 kg)   Height: 5\' 6"  (1.676 m)     Body mass index is 26.58 kg/m.  Physical Exam Vitals reviewed.  Constitutional:      General: She is awake.     Appearance: Normal appearance. She is well-developed and well-groomed.  HENT:     Head: Normocephalic and atraumatic.  Cardiovascular:     Rate and Rhythm: Normal rate and regular rhythm.     Pulses: Normal pulses.          Radial pulses are 2+ on the right side and 2+ on the left side.     Heart sounds: Normal heart sounds. No murmur heard.    No friction rub. No gallop.  Pulmonary:     Effort: Pulmonary effort is normal.     Breath sounds: Normal breath sounds. No decreased air movement. No decreased breath sounds, wheezing, rhonchi or rales.  Musculoskeletal:     Cervical back: Normal range of motion.     Right lower leg: No edema.     Left lower leg: No edema.  Neurological:     General: No focal deficit present.     Mental Status: She is alert and oriented to person, place, and time. Mental status is at baseline.     GCS: GCS eye subscore is 4. GCS verbal subscore is 5. GCS motor subscore is 6.  Psychiatric:        Attention and Perception: Attention and perception normal.        Mood and Affect: Mood and affect normal.        Speech: Speech normal.        Behavior: Behavior normal. Behavior is cooperative.        Thought Content: Thought content normal.        Cognition and Memory: Cognition normal.        Judgment: Judgment normal.      Recent Results (from the past 2160 hour(s))  CBC w/Diff/Platelet     Status: None   Collection Time: 12/15/22 10:17 AM  Result Value Ref Range   WBC 7.7 3.8 - 10.8 Thousand/uL   RBC 4.54 3.80 - 5.10 Million/uL   Hemoglobin 13.3 11.7 - 15.5 g/dL   HCT 02.5 42.7 - 06.2 %   MCV 90.1 80.0 - 100.0 fL   MCH 29.3 27.0 - 33.0 pg   MCHC 32.5 32.0 - 36.0 g/dL    Comment: For adults, a slight decrease in the calculated MCHC value (in the range of 30 to 32 g/dL) is most likely not  clinically significant; however, it should be interpreted with caution in correlation with other red cell parameters and the patient's clinical condition.    RDW 12.2 11.0 - 15.0 %   Platelets 353 140 - 400 Thousand/uL   MPV 10.8 7.5 - 12.5 fL   Neutro Abs 5,513 1,500 - 7,800 cells/uL   Absolute  Lymphocytes 1,563 850 - 3,900 cells/uL   Absolute Monocytes 501 200 - 950 cells/uL   Eosinophils Absolute 77 15 - 500 cells/uL   Basophils Absolute 46 0 - 200 cells/uL   Neutrophils Relative % 71.6 %   Total Lymphocyte 20.3 %   Monocytes Relative 6.5 %   Eosinophils Relative 1.0 %   Basophils Relative 0.6 %     PHQ2/9:    12/15/2022    9:46 AM 11/18/2022    2:44 PM 12/11/2020    3:32 PM 09/14/2020   11:03 AM 01/10/2020    8:26 AM  Depression screen PHQ 2/9  Decreased Interest 0 0 0 0 0  Down, Depressed, Hopeless 1 2 0 0 0  PHQ - 2 Score 1 2 0 0 0  Altered sleeping 1 2 0 0   Tired, decreased energy 1 2 0 0   Change in appetite 0 0 0 0   Feeling bad or failure about yourself  0 0 0 0   Trouble concentrating 0 2 0 0   Moving slowly or fidgety/restless 0 0 0 0   Suicidal thoughts 0 0 0 0   PHQ-9 Score 3 8 0 0   Difficult doing work/chores Somewhat difficult Somewhat difficult Not difficult at all Not difficult at all       Fall Risk:    12/15/2022    9:46 AM 11/18/2022    2:44 PM 12/11/2020    3:31 PM 09/14/2020   11:03 AM 01/10/2020    8:26 AM  Fall Risk   Falls in the past year? 0 0 0 0 0  Number falls in past yr: 0 0 0 0 0  Injury with Fall? 0 0 0 0 0  Risk for fall due to : No Fall Risks No Fall Risks No Fall Risks    Follow up Falls prevention discussed;Education provided;Falls evaluation completed Falls prevention discussed;Education provided;Falls evaluation completed Falls prevention discussed  Falls evaluation completed      Functional Status Survey: Is the patient deaf or have difficulty hearing?: Yes Does the patient have difficulty seeing, even when wearing  glasses/contacts?: No Does the patient have difficulty concentrating, remembering, or making decisions?: No Does the patient have difficulty walking or climbing stairs?: No Does the patient have difficulty dressing or bathing?: No Does the patient have difficulty doing errands alone such as visiting a doctor's office or shopping?: No    Assessment & Plan  Problem List Items Addressed This Visit       Cardiovascular and Mediastinum   Hypertension - Primary    Chronic, historic condition Appears exacerbated today despite taking her lisinopril 5 mg p.o. daily Will increase to lisinopril 10 mg p.o. daily for further control Recommend that she keeps checking blood pressure at home and records to review at follow-up Follow-up in 4 weeks or sooner if concerns arise      Relevant Medications   lisinopril (ZESTRIL) 10 MG tablet     Other   RUQ pain    Acute, ongoing She reports persistent upper quadrant pain across the right and left sides We reviewed the results of her recent rib x-ray and right upper quadrant ultrasound-both negative, normal Today we will check hepatic function panel, amylase, lipase, CMP, CBC for further rule out We also discussed using different bras as she is concerned that the weight of her breast is likely contributing to this discomfort Results of labs to dictate further management Follow-up as needed for progressing or persistent symptoms  Relevant Orders   Hepatic function panel   Amylase   Lipase   COMPLETE METABOLIC PANEL WITH GFR   CBC w/Diff/Platelet (Completed)   Other Visit Diagnoses     Encounter for screening mammogram for malignant neoplasm of breast       Relevant Orders   MM 3D SCREENING MAMMOGRAM BILATERAL BREAST        Return in about 4 weeks (around 01/12/2023) for HTN, RUQ pain.   I, Jarissa Sheriff E Karis Emig, PA-C, have reviewed all documentation for this visit. The documentation on 12/15/22 for the exam, diagnosis, procedures, and  orders are all accurate and complete.   Jacquelin Hawking, MHS, PA-C [facility]

## 2022-12-16 LAB — CBC WITH DIFFERENTIAL/PLATELET
Absolute Lymphocytes: 1563 {cells}/uL (ref 850–3900)
Absolute Monocytes: 501 {cells}/uL (ref 200–950)
Basophils Absolute: 46 {cells}/uL (ref 0–200)
Basophils Relative: 0.6 %
Eosinophils Absolute: 77 {cells}/uL (ref 15–500)
Eosinophils Relative: 1 %
HCT: 40.9 % (ref 35.0–45.0)
Hemoglobin: 13.3 g/dL (ref 11.7–15.5)
MCH: 29.3 pg (ref 27.0–33.0)
MCHC: 32.5 g/dL (ref 32.0–36.0)
MCV: 90.1 fL (ref 80.0–100.0)
MPV: 10.8 fL (ref 7.5–12.5)
Monocytes Relative: 6.5 %
Neutro Abs: 5513 {cells}/uL (ref 1500–7800)
Neutrophils Relative %: 71.6 %
Platelets: 353 10*3/uL (ref 140–400)
RBC: 4.54 10*6/uL (ref 3.80–5.10)
RDW: 12.2 % (ref 11.0–15.0)
Total Lymphocyte: 20.3 %
WBC: 7.7 10*3/uL (ref 3.8–10.8)

## 2022-12-16 LAB — COMPLETE METABOLIC PANEL WITH GFR
AG Ratio: 1.4 (calc) (ref 1.0–2.5)
ALT: 9 U/L (ref 6–29)
AST: 13 U/L (ref 10–35)
Albumin: 4.2 g/dL (ref 3.6–5.1)
Alkaline phosphatase (APISO): 75 U/L (ref 37–153)
BUN: 12 mg/dL (ref 7–25)
CO2: 27 mmol/L (ref 20–32)
Calcium: 9.7 mg/dL (ref 8.6–10.4)
Chloride: 105 mmol/L (ref 98–110)
Creat: 0.56 mg/dL (ref 0.50–1.03)
Globulin: 2.9 g/dL (ref 1.9–3.7)
Glucose, Bld: 95 mg/dL (ref 65–99)
Potassium: 4.7 mmol/L (ref 3.5–5.3)
Sodium: 138 mmol/L (ref 135–146)
Total Bilirubin: 0.4 mg/dL (ref 0.2–1.2)
Total Protein: 7.1 g/dL (ref 6.1–8.1)
eGFR: 111 mL/min/{1.73_m2} (ref >=60)

## 2022-12-16 LAB — HEPATIC FUNCTION PANEL
AG Ratio: 1.4 (calc) (ref 1.0–2.5)
ALT: 9 U/L (ref 6–29)
AST: 13 U/L (ref 10–35)
Albumin: 4.2 g/dL (ref 3.6–5.1)
Alkaline phosphatase (APISO): 75 U/L (ref 37–153)
Bilirubin, Direct: 0.1 mg/dL (ref 0.0–0.2)
Globulin: 2.9 g/dL (ref 1.9–3.7)
Indirect Bilirubin: 0.3 mg/dL (ref 0.2–1.2)
Total Bilirubin: 0.4 mg/dL (ref 0.2–1.2)
Total Protein: 7.1 g/dL (ref 6.1–8.1)

## 2022-12-16 LAB — LIPASE: Lipase: 10 U/L (ref 7–60)

## 2022-12-16 LAB — AMYLASE: Amylase: 45 U/L (ref 21–101)

## 2022-12-16 NOTE — Progress Notes (Signed)
Your labs are back Your liver function panel was normal Your pancreatic testing was normal Your electrolytes, kidney function were normal Your CBC was normal, no signs of anemia

## 2023-01-13 ENCOUNTER — Ambulatory Visit: Payer: BC Managed Care – PPO | Admitting: Physician Assistant

## 2023-03-24 ENCOUNTER — Other Ambulatory Visit: Payer: Self-pay | Admitting: Physician Assistant

## 2023-03-24 DIAGNOSIS — I10 Essential (primary) hypertension: Secondary | ICD-10-CM

## 2023-03-24 NOTE — Telephone Encounter (Signed)
Requested Prescriptions  Pending Prescriptions Disp Refills   lisinopril (ZESTRIL) 10 MG tablet [Pharmacy Med Name: LISINOPRIL 10 MG TABLET] 30 tablet 0    Sig: TAKE 1 TABLET BY MOUTH EVERY DAY     Cardiovascular:  ACE Inhibitors Failed - 03/24/2023  3:33 PM      Failed - Last BP in normal range    BP Readings from Last 1 Encounters:  12/15/22 (!) 132/90         Passed - Cr in normal range and within 180 days    Creat  Date Value Ref Range Status  12/15/2022 0.56 0.50 - 1.03 mg/dL Final         Passed - K in normal range and within 180 days    Potassium  Date Value Ref Range Status  12/15/2022 4.7 3.5 - 5.3 mmol/L Final         Passed - Patient is not pregnant      Passed - Valid encounter within last 6 months    Recent Outpatient Visits           3 months ago Hypertension, unspecified type   [place] [facility] Mecum, Oswaldo Conroy, PA-C   4 months ago RUQ pain   [facility] Mecum, Oswaldo Conroy, PA-C   2 years ago Bilateral low back pain without sciatica, unspecified chronicity   [facility] Danelle Berry, PA-C   2 years ago Left breast lump   [facility] Danelle Berry, PA-C   2 years ago Urinary frequency   [facility] Caro Laroche, [place]

## 2023-04-06 ENCOUNTER — Other Ambulatory Visit: Payer: Self-pay | Admitting: Family Medicine

## 2023-04-06 DIAGNOSIS — I10 Essential (primary) hypertension: Secondary | ICD-10-CM

## 2023-05-17 ENCOUNTER — Other Ambulatory Visit: Payer: Self-pay | Admitting: Physician Assistant

## 2023-05-17 DIAGNOSIS — I10 Essential (primary) hypertension: Secondary | ICD-10-CM

## 2023-05-31 ENCOUNTER — Ambulatory Visit
Admission: RE | Admit: 2023-05-31 | Discharge: 2023-05-31 | Disposition: A | Discharge status: Home / Self Care | Payer: Self-pay | Source: Ambulatory Visit | Attending: Physician Assistant | Admitting: Physician Assistant

## 2023-05-31 DIAGNOSIS — Z1231 Encounter for screening mammogram for malignant neoplasm of breast: Secondary | ICD-10-CM | POA: Diagnosis present

## 2023-06-05 ENCOUNTER — Other Ambulatory Visit: Payer: Self-pay | Admitting: Physician Assistant

## 2023-06-05 ENCOUNTER — Other Ambulatory Visit: Payer: Self-pay | Admitting: Family Medicine

## 2023-06-05 DIAGNOSIS — R928 Other abnormal and inconclusive findings on diagnostic imaging of breast: Secondary | ICD-10-CM

## 2023-06-08 ENCOUNTER — Ambulatory Visit
Admission: RE | Admit: 2023-06-08 | Discharge: 2023-06-08 | Disposition: A | Discharge status: Home / Self Care | Source: Ambulatory Visit | Attending: Family Medicine | Admitting: Family Medicine

## 2023-06-08 ENCOUNTER — Other Ambulatory Visit: Payer: Self-pay | Admitting: Family Medicine

## 2023-06-08 DIAGNOSIS — R928 Other abnormal and inconclusive findings on diagnostic imaging of breast: Secondary | ICD-10-CM

## 2023-06-20 ENCOUNTER — Ambulatory Visit
Admission: RE | Admit: 2023-06-20 | Discharge: 2023-06-20 | Disposition: A | Discharge status: Home / Self Care | Source: Ambulatory Visit | Attending: Family Medicine | Admitting: Family Medicine

## 2023-06-20 ENCOUNTER — Ambulatory Visit
Admission: RE | Admit: 2023-06-20 | Discharge: 2023-06-20 | Disposition: A | Discharge status: Home / Self Care | Source: Ambulatory Visit | Attending: Family Medicine

## 2023-06-20 DIAGNOSIS — N6002 Solitary cyst of left breast: Secondary | ICD-10-CM | POA: Diagnosis not present

## 2023-06-20 DIAGNOSIS — R928 Other abnormal and inconclusive findings on diagnostic imaging of breast: Secondary | ICD-10-CM

## 2023-06-20 DIAGNOSIS — N6322 Unspecified lump in the left breast, upper inner quadrant: Secondary | ICD-10-CM | POA: Diagnosis present

## 2023-06-20 DIAGNOSIS — N6089 Other benign mammary dysplasias of unspecified breast: Secondary | ICD-10-CM | POA: Insufficient documentation

## 2023-06-20 DIAGNOSIS — N649 Disorder of breast, unspecified: Secondary | ICD-10-CM | POA: Diagnosis present

## 2023-06-20 HISTORY — PX: BREAST BIOPSY: SHX20

## 2023-06-20 MED ORDER — LIDOCAINE 1 % OPTIME INJ - NO CHARGE
2.0000 mL | Freq: Once | INTRAMUSCULAR | Status: AC
  Administered 2023-06-20: 2 mL
  Filled 2023-06-20: qty 2

## 2023-06-20 MED ORDER — LIDOCAINE-EPINEPHRINE 1 %-1:100000 IJ SOLN
8.0000 mL | Freq: Once | INTRAMUSCULAR | Status: AC
  Administered 2023-06-20: 8 mL

## 2023-06-21 LAB — SURGICAL PATHOLOGY

## 2023-07-21 ENCOUNTER — Ambulatory Visit: Admitting: Family Medicine

## 2023-07-21 ENCOUNTER — Encounter: Payer: Self-pay | Admitting: Family Medicine

## 2023-07-21 DIAGNOSIS — I10 Essential (primary) hypertension: Secondary | ICD-10-CM

## 2023-07-21 MED ORDER — LISINOPRIL 10 MG PO TABS: 10.0000 mg | ORAL_TABLET | Freq: Every day | ORAL | 1 refills | Status: AC

## 2023-07-21 NOTE — Progress Notes (Signed)
 Name: Shirley Harris   MRN: [id number]    DOB: September 30, 1970   Date:07/21/2023       Progress Note  Chief Complaint  Patient presents with   Hypertension   Medication Refill    Lisinopril      Subjective:   Shirley Harris is a 53 y.o. female, presents to clinic for routine follow up on chronic conditions  Here for HTN and BP med refills hx of allergies a well   Current Outpatient Medications:    albuterol  (VENTOLIN  HFA) 108 (90 Base) MCG/ACT inhaler, Inhale 2 puffs into the lungs every 6 (six) hours as needed for wheezing or shortness of breath., Disp: 18 g, Rfl: 2   fluticasone  (FLONASE ) 50 MCG/ACT nasal spray, Place 2 sprays into both nostrils daily., Disp: 16 g, Rfl: 11   levocetirizine (XYZAL ) 5 MG tablet, Take 1 tablet (5 mg total) by mouth every evening., Disp: 90 tablet, Rfl: 1   loratadine (CLARITIN) 10 MG tablet, Take by mouth., Disp: , Rfl:    montelukast  (SINGULAIR ) 10 MG tablet, Take 1 tablet (10 mg total) by mouth at bedtime., Disp: 90 tablet, Rfl: 3   nortriptyline  (PAMELOR ) 10 MG capsule, Take 30 mg by mouth at bedtime., Disp: , Rfl:    lisinopril  (ZESTRIL ) 10 MG tablet, TAKE 1 TABLET BY MOUTH EVERY DAY, Disp: 30 tablet, Rfl: 0  Patient Active Problem List   Diagnosis Date Noted   RUQ pain 12/15/2022   Reactive airway disease 11/18/2022   Back pain 04/13/2020   Seasonal allergic rhinitis 06/25/2019   Fibroids, submucosal 01/29/2018   Hypertension 12/04/2017   Overweight (BMI 25.0-29.9) 12/04/2017   Migraine 12/04/2017   Insomnia 12/04/2017   Abnormal mammogram 05/14/2013    Past Surgical History:  Procedure Laterality Date   ABDOMINAL HYSTERECTOMY     BREAST BIOPSY Right 2015   Benign breast tissue with cystic and papillary apically metaplasia   BREAST BIOPSY Left 06/20/2023   US  Bx, Ribbon clip, path pending   BREAST BIOPSY Left 06/20/2023   US  LT BREAST BX W LOC DEV 1ST LESION IMG BX SPEC US  GUIDE 06/20/2023 [facility]   CESAREAN SECTION      COLONOSCOPY WITH PROPOFOL  N/A 02/21/2019   Procedure: COLONOSCOPY WITH PROPOFOL ;  Surgeon: Unk Corinn Skiff, MD;  Location: [facility];  Service: Gastroenterology;  Laterality: N/A;   LAPAROSCOPIC VAGINAL HYSTERECTOMY WITH SALPINGECTOMY Bilateral 01/29/2018   Procedure: LAPAROSCOPIC ASSISTED VAGINAL HYSTERECTOMY WITH BILATERAL SALPINGECTOMY;  Surgeon: Janit Alm Agent, MD;  Location: [facility];  Service: Gynecology;  Laterality: Bilateral;   TUBAL LIGATION  2010    Family History  Problem Relation Age of Onset   Stroke Mother    Multiple sclerosis Father    Breast cancer Maternal Aunt    Heart attack Maternal Grandfather     Social History   Tobacco Use   Smoking status: Never   Smokeless tobacco: Never  Vaping Use   Vaping status: Never Used  Substance Use Topics   Alcohol use: Not Currently   Drug use: Never     Allergies  Allergen Reactions   Acetaminophen Itching, Swelling and Other (See Comments)    Also notes BURNING   Shellfish Allergy Itching and Other (See Comments)    Itching of tongue and throat    Health Maintenance  Topic Date Due   Pneumococcal Vaccine 67-2 Years old (1 of 2 - PCV) Never done   Cervical Cancer Screening (HPV/Pap Cotest)  11/17/2022   COVID-19 Vaccine (3 - 2024-25 season)  08/05/2023 (Originally 10/09/2022)   Zoster Vaccines- Shingrix (1 of 2) 10/20/2023 (Originally 12/06/2022)   INFLUENZA VACCINE  09/08/2023   MAMMOGRAM  06/07/2024   DTaP/Tdap/Td (2 - Td or Tdap) 12/05/2027   Colonoscopy  02/20/2029   Hepatitis C Screening  Completed   HIV Screening  Completed   HPV VACCINES  Aged Out   Meningococcal B Vaccine  Aged Out    Chart Review Today: I personally reviewed active problem list, medication list, allergies, family history, social history, health maintenance, notes from last encounter, lab results, imaging with the patient/caregiver today.   Review of Systems  Constitutional: Negative.   HENT: Negative.    Eyes:  Negative.   Respiratory: Negative.    Cardiovascular: Negative.   Gastrointestinal: Negative.   Endocrine: Negative.   Genitourinary: Negative.   Musculoskeletal: Negative.   Skin: Negative.   Allergic/Immunologic: Negative.   Neurological: Negative.   Hematological: Negative.   Psychiatric/Behavioral: Negative.    All other systems reviewed and are negative.    Objective:   Vitals:   07/21/23 0951  BP: 136/82  Pulse: 95  Temp: 97.9 F (36.6 C)  TempSrc: Oral  SpO2: 98%  Weight: 171 lb 11.2 oz (77.9 kg)  Height: 5' 6 (1.676 m)    Body mass index is 27.71 kg/m.  Physical Exam Vitals and nursing note reviewed.  Constitutional:      General: She is not in acute distress.    Appearance: Normal appearance. She is well-developed. She is not ill-appearing, toxic-appearing or diaphoretic.  HENT:     Head: Normocephalic and atraumatic.     Right Ear: External ear normal.     Left Ear: External ear normal.     Nose: Nose normal.   Eyes:     General: No scleral icterus.       Right eye: No discharge.        Left eye: No discharge.     Conjunctiva/sclera: Conjunctivae normal.   Neck:     Trachea: No tracheal deviation.   Cardiovascular:     Rate and Rhythm: Normal rate.  Pulmonary:     Effort: Pulmonary effort is normal. No respiratory distress.     Breath sounds: No stridor.   Skin:    General: Skin is warm and dry.     Findings: No rash.   Neurological:     Mental Status: She is alert.     Motor: No abnormal muscle tone.     Coordination: Coordination normal.     Gait: Gait normal.   Psychiatric:        Mood and Affect: Mood normal.        Behavior: Behavior normal.        Results for orders placed or performed during the hospital encounter of 06/20/23  Surgical pathology   Collection Time: 06/20/23 12:00 AM  Result Value Ref Range   SURGICAL PATHOLOGY      SURGICAL PATHOLOGY [facility] [address] [place], [place] [address] Telephone [phone] or [phone] Fax [phone]  REPORT OF SURGICAL PATHOLOGY   Accession #: [id number] Patient Name: Shirley Harris Visit # : [id number]  MRN: [id number] Physician: Correne Krabbe DOB/Age [birth year] (Age: 46) Gender: F Collected Date: 06/20/2023 Received Date: 06/20/2023  FINAL DIAGNOSIS       1. Breast, left, needle core biopsy, 10 o'clock, 3cmfn, ribbon clip :       - FIBROEPITHELIAL LESION WITH PROMINENT INTRACANALICULAR PATTERN, SEE NOTE.      -  NEGATIVE FOR ATYPIA AND MALIGNANCY.       Diagnosis Note : Sections demonstrate fragments of a fibroepithelial lesion with      intracanalicular architecture and mildly cellular fibrous stroma. Cytologic      atypia, stromal overgrowth, an increase in mitotic figures, and malignancy are      not identified. The findings in this biopsy are favored to represent a      fibroadenoma with  intracanalicular architecture. If this is a limited sample of      a much larger lesion these findings may not be representative and correlation      with radiographic findings and clinical impression is required.      DATE SIGNED OUT: 06/21/2023 ELECTRONIC SIGNATURE : Janel Md, Rexene , Pathologist, Electronic Signature  MICROSCOPIC DESCRIPTION  CASE COMMENTS STAINS USED IN DIAGNOSIS: H&E-2 H&E-3 H&E-4 H&E    CLINICAL HISTORY  SPECIMEN(S) OBTAINED 1. Breast, left, needle core biopsy, 10 O'clock, 3cmfn, Ribbon Clip  SPECIMEN COMMENTS: 1. TIF: 8:29 AM, CIT less than 30 sec; oval mass; previous biopsy: contralateral benign biopsy SPECIMEN CLINICAL INFORMATION: 1. Fibroadenoma, PASH, FCC, malignancy    Gross Description 1. Received in formalin in a jar labeled left breast 10 oclk 3cfn are multiple fragments of tan-yellow, fibrofatty tissue aggregating to 1.0 x 0.9 x 0.2 cm submitted entirely in 1 block.      CIT: <30 seconds      TIF: [id number]         Report signed  out from the following location(s) [place] [address] [place], [place] [address] CLIA #: [id number]  [facility] [address] [place], [place] [address] CLIA #: [id number]       Assessment & Plan:   Hypertension, unspecified type Assessment & Plan: BP slightly elevated today pt is out of meds and has been dealing with other medical issues - suspected IIH/HA's vision changes seeing multiple specialists Refills current meds and doses and ask the pt do a BP recheck after back on meds On lisinopril  10 mg BP Readings from Last 3 Encounters:  07/21/23 136/82  12/15/22 (!) 132/90  11/18/22 (!) 144/90     Orders: -     Lisinopril ; Take 1 tablet (10 mg total) by mouth daily.  Dispense: 90 tablet; Refill: 1   Wt Readings from Last 5 Encounters:  07/21/23 171 lb 11.2 oz (77.9 kg)  12/15/22 164 lb 11.2 oz (74.7 kg)  11/18/22 166 lb 9.6 oz (75.6 kg)  12/11/20 177 lb (80.3 kg)  09/14/20 177 lb 3.2 oz (80.4 kg)   BMI Readings from Last 5 Encounters:  07/21/23 27.71 kg/m  12/15/22 26.58 kg/m  11/18/22 26.89 kg/m  12/11/20 28.57 kg/m  09/14/20 28.60 kg/m   [facility] - did LP and MRI  [facility]  -    Return for 1 month bp recheck, and CPE .   Shirley Cower, PA-C 07/21/23 10:13 AM

## 2023-08-01 NOTE — Assessment & Plan Note (Signed)
 BP slightly elevated today pt is out of meds and has been dealing with other medical issues - suspected IIH/HA's vision changes seeing multiple specialists Refills current meds and doses and ask the pt do a BP recheck after back on meds On lisinopril  10 mg BP Readings from Last 3 Encounters:  07/21/23 136/82  12/15/22 (!) 132/90  11/18/22 (!) 144/90

## 2023-08-17 DIAGNOSIS — H34831 Tributary (branch) retinal vein occlusion, right eye, with macular edema: Secondary | ICD-10-CM | POA: Insufficient documentation

## 2023-08-23 ENCOUNTER — Encounter: Admitting: Family Medicine

## 2023-08-23 DIAGNOSIS — Z124 Encounter for screening for malignant neoplasm of cervix: Secondary | ICD-10-CM

## 2023-08-23 DIAGNOSIS — Z0000 Encounter for general adult medical examination without abnormal findings: Secondary | ICD-10-CM

## 2023-08-23 DIAGNOSIS — I10 Essential (primary) hypertension: Secondary | ICD-10-CM

## 2023-09-26 ENCOUNTER — Encounter: Admitting: Family Medicine
